# Patient Record
Sex: Female | Born: 1960 | Race: White | Hispanic: No | Marital: Married | Smoking: Current every day smoker
Health system: Southern US, Community
[De-identification: ages and names within clinical notes are randomized; demographics above are authoritative.]

## PROBLEM LIST (undated history)

## (undated) DIAGNOSIS — J309 Allergic rhinitis, unspecified: Secondary | ICD-10-CM

## (undated) DIAGNOSIS — J449 Chronic obstructive pulmonary disease, unspecified: Secondary | ICD-10-CM

## (undated) DIAGNOSIS — G8929 Other chronic pain: Secondary | ICD-10-CM

## (undated) DIAGNOSIS — M47816 Spondylosis without myelopathy or radiculopathy, lumbar region: Secondary | ICD-10-CM

## (undated) DIAGNOSIS — F419 Anxiety disorder, unspecified: Secondary | ICD-10-CM

---
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
## 1993-03-29 HISTORY — PX: SEPTOPLASTY: SUR1290

## 2001-11-13 ENCOUNTER — Encounter: Payer: Self-pay | Admitting: Family Medicine

## 2001-11-13 ENCOUNTER — Ambulatory Visit (HOSPITAL_COMMUNITY): Admission: RE | Admit: 2001-11-13 | Discharge: 2001-11-13 | Payer: Self-pay | Admitting: Family Medicine

## 2002-12-05 ENCOUNTER — Ambulatory Visit (HOSPITAL_COMMUNITY): Admission: RE | Admit: 2002-12-05 | Discharge: 2002-12-05 | Payer: Self-pay | Admitting: Family Medicine

## 2002-12-05 ENCOUNTER — Encounter: Payer: Self-pay | Admitting: Family Medicine

## 2003-12-09 ENCOUNTER — Ambulatory Visit (HOSPITAL_COMMUNITY): Admission: RE | Admit: 2003-12-09 | Discharge: 2003-12-09 | Payer: Self-pay | Admitting: Family Medicine

## 2011-12-16 ENCOUNTER — Other Ambulatory Visit (HOSPITAL_COMMUNITY): Payer: Self-pay | Admitting: Family Medicine

## 2011-12-16 DIAGNOSIS — Z139 Encounter for screening, unspecified: Secondary | ICD-10-CM

## 2011-12-23 ENCOUNTER — Ambulatory Visit (HOSPITAL_COMMUNITY): Payer: Self-pay

## 2012-02-28 ENCOUNTER — Ambulatory Visit (HOSPITAL_COMMUNITY)
Admission: RE | Admit: 2012-02-28 | Discharge: 2012-02-28 | Disposition: A | Discharge status: Home / Self Care | Payer: Medicaid Other | Source: Ambulatory Visit | Attending: Family Medicine | Admitting: Family Medicine

## 2012-02-28 DIAGNOSIS — Z1231 Encounter for screening mammogram for malignant neoplasm of breast: Secondary | ICD-10-CM | POA: Insufficient documentation

## 2012-02-28 DIAGNOSIS — Z139 Encounter for screening, unspecified: Secondary | ICD-10-CM

## 2012-03-01 ENCOUNTER — Other Ambulatory Visit: Payer: Self-pay | Admitting: Family Medicine

## 2012-03-01 DIAGNOSIS — R928 Other abnormal and inconclusive findings on diagnostic imaging of breast: Secondary | ICD-10-CM

## 2012-03-15 ENCOUNTER — Ambulatory Visit (HOSPITAL_COMMUNITY)
Admission: RE | Admit: 2012-03-15 | Discharge: 2012-03-15 | Disposition: A | Discharge status: Home / Self Care | Payer: Medicaid Other | Source: Ambulatory Visit | Attending: Family Medicine | Admitting: Family Medicine

## 2012-03-15 ENCOUNTER — Other Ambulatory Visit: Payer: Self-pay | Admitting: Family Medicine

## 2012-03-15 DIAGNOSIS — R928 Other abnormal and inconclusive findings on diagnostic imaging of breast: Secondary | ICD-10-CM

## 2012-07-12 HISTORY — PX: COLONOSCOPY: SHX174

## 2013-01-05 ENCOUNTER — Emergency Department (HOSPITAL_COMMUNITY): Payer: Medicaid Other

## 2013-01-05 ENCOUNTER — Inpatient Hospital Stay (HOSPITAL_COMMUNITY)
Admission: EM | Admit: 2013-01-05 | Discharge: 2013-01-09 | DRG: 190 | Disposition: A | Discharge status: Home / Self Care | Payer: Medicaid Other | Attending: Internal Medicine | Admitting: Internal Medicine

## 2013-01-05 ENCOUNTER — Inpatient Hospital Stay (HOSPITAL_COMMUNITY): Payer: Medicaid Other

## 2013-01-05 ENCOUNTER — Encounter: Payer: Self-pay | Admitting: Internal Medicine

## 2013-01-05 ENCOUNTER — Encounter (HOSPITAL_COMMUNITY): Payer: Self-pay

## 2013-01-05 DIAGNOSIS — R0609 Other forms of dyspnea: Secondary | ICD-10-CM

## 2013-01-05 DIAGNOSIS — J309 Allergic rhinitis, unspecified: Secondary | ICD-10-CM | POA: Diagnosis present

## 2013-01-05 DIAGNOSIS — J218 Acute bronchiolitis due to other specified organisms: Secondary | ICD-10-CM | POA: Diagnosis present

## 2013-01-05 DIAGNOSIS — I5033 Acute on chronic diastolic (congestive) heart failure: Secondary | ICD-10-CM | POA: Diagnosis present

## 2013-01-05 DIAGNOSIS — J9600 Acute respiratory failure, unspecified whether with hypoxia or hypercapnia: Secondary | ICD-10-CM | POA: Diagnosis present

## 2013-01-05 DIAGNOSIS — G8929 Other chronic pain: Secondary | ICD-10-CM | POA: Diagnosis present

## 2013-01-05 DIAGNOSIS — J441 Chronic obstructive pulmonary disease with (acute) exacerbation: Principal | ICD-10-CM | POA: Diagnosis present

## 2013-01-05 DIAGNOSIS — I509 Heart failure, unspecified: Secondary | ICD-10-CM | POA: Diagnosis present

## 2013-01-05 DIAGNOSIS — E876 Hypokalemia: Secondary | ICD-10-CM | POA: Diagnosis present

## 2013-01-05 DIAGNOSIS — F411 Generalized anxiety disorder: Secondary | ICD-10-CM | POA: Diagnosis present

## 2013-01-05 DIAGNOSIS — J189 Pneumonia, unspecified organism: Secondary | ICD-10-CM | POA: Diagnosis present

## 2013-01-05 DIAGNOSIS — J479 Bronchiectasis, uncomplicated: Secondary | ICD-10-CM | POA: Diagnosis present

## 2013-01-05 DIAGNOSIS — J9601 Acute respiratory failure with hypoxia: Secondary | ICD-10-CM | POA: Diagnosis present

## 2013-01-05 DIAGNOSIS — R0902 Hypoxemia: Secondary | ICD-10-CM | POA: Diagnosis present

## 2013-01-05 DIAGNOSIS — R0600 Dyspnea, unspecified: Secondary | ICD-10-CM

## 2013-01-05 DIAGNOSIS — F17200 Nicotine dependence, unspecified, uncomplicated: Secondary | ICD-10-CM | POA: Diagnosis present

## 2013-01-05 DIAGNOSIS — Z79899 Other long term (current) drug therapy: Secondary | ICD-10-CM

## 2013-01-05 DIAGNOSIS — D72829 Elevated white blood cell count, unspecified: Secondary | ICD-10-CM | POA: Diagnosis present

## 2013-01-05 DIAGNOSIS — J84115 Respiratory bronchiolitis interstitial lung disease: Secondary | ICD-10-CM | POA: Diagnosis present

## 2013-01-05 HISTORY — DX: Allergic rhinitis, unspecified: J30.9

## 2013-01-05 HISTORY — DX: Other chronic pain: G89.29

## 2013-01-05 HISTORY — DX: Chronic obstructive pulmonary disease, unspecified: J44.9

## 2013-01-05 HISTORY — DX: Spondylosis without myelopathy or radiculopathy, lumbar region: M47.816

## 2013-01-05 HISTORY — DX: Anxiety disorder, unspecified: F41.9

## 2013-01-05 LAB — BASIC METABOLIC PANEL
BUN: 19 mg/dL (ref 6–23)
Calcium: 9.7 mg/dL (ref 8.4–10.5)
GFR calc non Af Amer: >90 mL/min (ref >90)
Glucose, Bld: 100 mg/dL — ABNORMAL HIGH (ref 70–99)

## 2013-01-05 LAB — CBC WITH DIFFERENTIAL/PLATELET
Basophils Absolute: 0.2 10*3/uL — ABNORMAL HIGH (ref 0.0–0.1)
Eosinophils Absolute: 0.2 10*3/uL (ref 0.0–0.7)
Lymphs Abs: 1.7 10*3/uL (ref 0.7–4.0)
MCH: 32.7 pg (ref 26.0–34.0)
MCHC: 34.5 g/dL (ref 30.0–36.0)
MCV: 94.6 fL (ref 78.0–100.0)
Monocytes Absolute: 2.4 10*3/uL — ABNORMAL HIGH (ref 0.1–1.0)
Platelets: 419 10*3/uL — ABNORMAL HIGH (ref 150–400)
RDW: 13.2 % (ref 11.5–15.5)
WBC: 23.7 10*3/uL — ABNORMAL HIGH (ref 4.0–10.5)

## 2013-01-05 LAB — MRSA PCR SCREENING: MRSA by PCR: NEGATIVE

## 2013-01-05 LAB — STREP PNEUMONIAE URINARY ANTIGEN: Strep Pneumo Urinary Antigen: NEGATIVE

## 2013-01-05 LAB — HIV ANTIBODY (ROUTINE TESTING W REFLEX): HIV: NONREACTIVE

## 2013-01-05 LAB — EXPECTORATED SPUTUM ASSESSMENT W GRAM STAIN, RFLX TO RESP C

## 2013-01-05 MED ORDER — MORPHINE SULFATE ER 30 MG PO TBCR
30.0000 mg | EXTENDED_RELEASE_TABLET | Freq: Two times a day (BID) | ORAL | Status: DC
  Administered 2013-01-05 – 2013-01-09 (×8): 30 mg via ORAL
  Filled 2013-01-05 (×9): qty 1

## 2013-01-05 MED ORDER — SODIUM CHLORIDE 0.9 % IV SOLN
INTRAVENOUS | Status: DC
  Administered 2013-01-05: 12:00:00 via INTRAVENOUS

## 2013-01-05 MED ORDER — ALBUTEROL (5 MG/ML) CONTINUOUS INHALATION SOLN
15.0000 mg/h | INHALATION_SOLUTION | Freq: Once | RESPIRATORY_TRACT | Status: AC
  Administered 2013-01-05: 15 mg/h via RESPIRATORY_TRACT
  Filled 2013-01-05: qty 20

## 2013-01-05 MED ORDER — SODIUM CHLORIDE 0.9 % IJ SOLN: 3.0000 mL | INTRAMUSCULAR | Status: DC | PRN

## 2013-01-05 MED ORDER — FUROSEMIDE 10 MG/ML IJ SOLN
40.0000 mg | Freq: Once | INTRAMUSCULAR | Status: AC
  Administered 2013-01-05: 40 mg via INTRAVENOUS
  Filled 2013-01-05: qty 4

## 2013-01-05 MED ORDER — SERTRALINE HCL 100 MG PO TABS
150.0000 mg | ORAL_TABLET | Freq: Every day | ORAL | Status: DC
  Administered 2013-01-05 – 2013-01-09 (×5): 150 mg via ORAL
  Filled 2013-01-05 (×5): qty 1

## 2013-01-05 MED ORDER — METHYLPREDNISOLONE SODIUM SUCC 125 MG IJ SOLR
125.0000 mg | Freq: Once | INTRAMUSCULAR | Status: AC
  Administered 2013-01-05: 125 mg via INTRAVENOUS
  Filled 2013-01-05: qty 2

## 2013-01-05 MED ORDER — ALBUTEROL SULFATE (5 MG/ML) 0.5% IN NEBU
2.5000 mg | INHALATION_SOLUTION | RESPIRATORY_TRACT | Status: DC
  Administered 2013-01-05 – 2013-01-09 (×23): 2.5 mg via RESPIRATORY_TRACT
  Filled 2013-01-05 (×23): qty 0.5

## 2013-01-05 MED ORDER — ALPRAZOLAM 1 MG PO TABS
2.0000 mg | ORAL_TABLET | Freq: Three times a day (TID) | ORAL | Status: DC | PRN
Start: 2013-01-05 — End: 2013-01-09
  Administered 2013-01-07 – 2013-01-08 (×4): 2 mg via ORAL
  Filled 2013-01-05 (×5): qty 2

## 2013-01-05 MED ORDER — IOHEXOL 300 MG/ML  SOLN
80.0000 mL | Freq: Once | INTRAMUSCULAR | Status: AC | PRN
  Administered 2013-01-05: 80 mL via INTRAVENOUS

## 2013-01-05 MED ORDER — AZITHROMYCIN 500 MG PO TABS
500.0000 mg | ORAL_TABLET | ORAL | Status: DC
  Administered 2013-01-05 – 2013-01-08 (×4): 500 mg via ORAL
  Filled 2013-01-05 (×5): qty 1

## 2013-01-05 MED ORDER — LEVOFLOXACIN IN D5W 500 MG/100ML IV SOLN
500.0000 mg | INTRAVENOUS | Status: DC
  Filled 2013-01-05: qty 100

## 2013-01-05 MED ORDER — METHYLPREDNISOLONE SODIUM SUCC 40 MG IJ SOLR
40.0000 mg | Freq: Four times a day (QID) | INTRAMUSCULAR | Status: DC
  Administered 2013-01-05 – 2013-01-06 (×4): 40 mg via INTRAVENOUS
  Filled 2013-01-05 (×7): qty 1

## 2013-01-05 MED ORDER — IPRATROPIUM BROMIDE 0.02 % IN SOLN
1.0000 mg | Freq: Once | RESPIRATORY_TRACT | Status: AC
  Administered 2013-01-05: 1 mg via RESPIRATORY_TRACT
  Filled 2013-01-05 (×2): qty 2.5

## 2013-01-05 MED ORDER — SODIUM CHLORIDE 0.9 % IV SOLN: 250.0000 mL | INTRAVENOUS | Status: DC | PRN

## 2013-01-05 MED ORDER — ALBUTEROL SULFATE (5 MG/ML) 0.5% IN NEBU: 2.5000 mg | INHALATION_SOLUTION | RESPIRATORY_TRACT | Status: DC | PRN

## 2013-01-05 MED ORDER — DEXTROSE 5 % IV SOLN
1.0000 g | INTRAVENOUS | Status: DC
  Administered 2013-01-05 – 2013-01-08 (×4): 1 g via INTRAVENOUS
  Filled 2013-01-05 (×5): qty 10

## 2013-01-05 MED ORDER — ENOXAPARIN SODIUM 40 MG/0.4ML ~~LOC~~ SOLN
40.0000 mg | SUBCUTANEOUS | Status: DC
  Administered 2013-01-05 – 2013-01-08 (×4): 40 mg via SUBCUTANEOUS
  Filled 2013-01-05 (×5): qty 0.4

## 2013-01-05 MED ORDER — SODIUM CHLORIDE 0.9 % IJ SOLN
3.0000 mL | Freq: Two times a day (BID) | INTRAMUSCULAR | Status: DC
  Administered 2013-01-05 – 2013-01-08 (×5): 3 mL via INTRAVENOUS

## 2013-01-05 NOTE — ED Provider Notes (Signed)
CSN: [id number]     Arrival date & time 01/05/13  1016 History   First MD Initiated Contact with Patient 01/05/13 1034     Chief Complaint  Patient presents with  . Pneumonia    HPI Pt was seen at 1045.  Per pt, c/o gradual onset and worsening of persistent cough, wheezing and SOB for the past 6 days.  Has been associated with generalized body aches/fatigue and subjective home fevers/chills. States she has been using her home MDI without relief. Pt was evaluated by her PMD this morning, and then sent to the ED for further evaluation and admission for hypoxia and possible pneumonia. Pt has significant hx COPD and continues to smoke cigarettes. Denies CP/palpitations, no back pain, no abd pain, no N/V/D, no rash.    Past Medical History  Diagnosis Date  . Chronic pain   . Pneumonia   . COPD (chronic obstructive pulmonary disease)   . Anxiety   . Allergic rhinitis   . Lumbar spondylosis    Past Surgical History  Procedure Laterality Date  . Back surgery    . Septoplasty  03/29/1993  . Tubal ligation  1973  . Cholecystectomy  1987  . Appendectomy  1987    History  Substance Use Topics  . Smoking status: Current Every Day Smoker  . Smokeless tobacco: Never Used  . Alcohol Use: No    Review of Systems ROS: Statement: All systems negative except as marked or noted in the HPI; Constitutional: +subjective fever and chills, generalized body aches/fatigue. ; ; Eyes: Negative for eye pain, redness and discharge. ; ; ENMT: Negative for ear pain, hoarseness, nasal congestion, sinus pressure and sore throat. ; ; Cardiovascular: Negative for chest pain, palpitations, diaphoresis, and peripheral edema. ; ; Respiratory: +cough, wheezing, SOB. Negative for stridor. ; ; Gastrointestinal: Negative for nausea, vomiting, diarrhea, abdominal pain, blood in stool, hematemesis, jaundice and rectal bleeding. . ; ; Genitourinary: Negative for dysuria, flank pain and hematuria. ; ; Musculoskeletal: Negative  for back pain and neck pain. Negative for swelling and trauma.; ; Skin: Negative for pruritus, rash, abrasions, blisters, bruising and skin lesion.; ; Neuro: Negative for headache, lightheadedness and neck stiffness. Negative for weakness, altered level of consciousness , altered mental status, extremity weakness, paresthesias, involuntary movement, seizure and syncope.      Allergies  Review of patient's allergies indicates no known allergies.  Home Medications   Current Outpatient Rx  Name  Route  Sig  Dispense  Refill  . alprazolam (XANAX) 2 MG tablet   Oral   Take 2 mg by mouth 3 (three) times daily as needed for anxiety.         Marland Kitchen HYDROcodone-acetaminophen (NORCO) 10-325 MG per tablet   Oral   Take 1 tablet by mouth every 6 (six) hours as needed for pain.         Marland Kitchen morphine (MS CONTIN) 30 MG 12 hr tablet   Oral   Take 30 mg by mouth 2 (two) times daily.         . sertraline (ZOLOFT) 100 MG tablet   Oral   Take 150 mg by mouth daily.          BP 124/67  Pulse 136  Temp(Src) 97.8 F (36.6 C) (Oral)  Resp 20  SpO2 91% Physical Exam 1050: Physical examination:  Nursing notes reviewed; Vital signs and O2 SAT reviewed;  Constitutional: Well developed, Well nourished, Uncomfortable appearing; Head:  Normocephalic, atraumatic; Eyes: EOMI, PERRL, No scleral  icterus; ENMT: Mouth and pharynx normal, Mucous membranes dry; Neck: Supple, Full range of motion, No lymphadenopathy; Cardiovascular: Tachycardic rate and rhythm, No gallop; Respiratory: Breath sounds diminished & equal bilaterally, faint scattered wheezes.  Speaking in phrases. +moist cough. No retrax or access mm use.Tachypneic.; Chest: Nontender, Movement normal; Abdomen: Soft, Nontender, Nondistended, Normal bowel sounds; Genitourinary: No CVA tenderness; Extremities: Pulses normal, No tenderness, No edema, No calf edema or asymmetry.; Neuro: AA&Ox3, Major CN grossly intact.  Speech clear. No gross focal motor or  sensory deficits in extremities.; Skin: Color normal, Warm, Dry.   ED Course  Procedures   1055: On arrival, pt O2 Sats in 60-70's on R/A. Tachypneic, lung sounds significantly diminished. Will start continuous neb and dose IV solumedrol.   1200: PCXR without acute infiltrate, has diffuse interstitial airspace disease; questioning edema. BNP ordered. Continuous neb completed. Lungs now diminished but coarse bilat. Sats 91-96% on O2 2L N/C. States she feels her breathing is "better" after the neb. Speaking full sentences, less tachypneic. Does endorse being "shakey." Sinus tachycardia on monitor. Dx and testing d/w pt and family.  Questions answered.  Verb understanding, agreeable to admit.  1225:  BNP elevated, will dose IV lasix. Will start IV abx for COPD exacerbation. T/C to [facility] Dr. Izola Price, case discussed, including:  HPI, pertinent PM/SHx, VS/PE, dx testing, ED course and treatment:  Agreeable to admit, requests to write temporary orders, obtain stepdown bed to team 8.   MDM  MDM Reviewed: previous chart, nursing note and vitals Reviewed previous: labs Interpretation: labs, ECG and x-ray Total time providing critical care: 30-74 minutes. This excludes time spent performing separately reportable procedures and services. Consults: admitting MD     CRITICAL CARE Performed by: Laray Anger Total critical care time: 45 Critical care time was exclusive of separately billable procedures and treating other patients. Critical care was necessary to treat or prevent imminent or life-threatening deterioration. Critical care was time spent personally by me on the following activities: development of treatment plan with patient and/or surrogate as well as nursing, discussions with consultants, evaluation of patient's response to treatment, examination of patient, obtaining history from patient or surrogate, ordering and performing treatments and interventions, ordering and review of  laboratory studies, ordering and review of radiographic studies, pulse oximetry and re-evaluation of patient's condition.    Date: 01/05/2013  Rate: 103  Rhythm: sinus tachycardia  QRS Axis: normal  Intervals: normal  ST/T Wave abnormalities: normal  Conduction Disutrbances:none  Narrative Interpretation:   Old EKG Reviewed: none available.  Dg Chest Port 1 View 01/05/2013   *RADIOLOGY REPORT*  Clinical Data: Pneumonia.  PORTABLE CHEST - 1 VIEW  Comparison: Two-view chest 06/30/2012.  Findings: The heart size is normal.  A diffuse interstitial pattern is evident.  Biapical pleural thickening is stable. Emphysematous changes are again noted.  No focal airspace consolidation is present.  The visualized soft tissues and bony thorax are unremarkable.  IMPRESSION:  1.  Diffuse interstitial and airspace disease likely reflects edema.  Atypical infection is also considered. 2.  Emphysema.   Original Report Authenticated By: Marin Roberts, M.D.    Results for orders placed during the hospital encounter of 01/05/13  TROPONIN I      Result Value Range   Troponin I <0.30  <0.30 ng/mL  CBC WITH DIFFERENTIAL      Result Value Range   WBC 23.7 (*) 4.0 - 10.5 K/uL   RBC 4.10  3.87 - 5.11 MIL/uL   Hemoglobin 13.4  12.0 -  15.0 g/dL   HCT 16.1  09.6 - 04.5 %   MCV 94.6  78.0 - 100.0 fL   MCH 32.7  26.0 - 34.0 pg   MCHC 34.5  30.0 - 36.0 g/dL   RDW 40.9  81.1 - 91.4 %   Platelets 419 (*) 150 - 400 K/uL   Neutrophils Relative % 81 (*) 43 - 77 %   Lymphocytes Relative 7 (*) 12 - 46 %   Monocytes Relative 10  3 - 12 %   Eosinophils Relative 1  0 - 5 %   Basophils Relative 1  0 - 1 %   Neutro Abs 19.2 (*) 1.7 - 7.7 K/uL   Lymphs Abs 1.7  0.7 - 4.0 K/uL   Monocytes Absolute 2.4 (*) 0.1 - 1.0 K/uL   Eosinophils Absolute 0.2  0.0 - 0.7 K/uL   Basophils Absolute 0.2 (*) 0.0 - 0.1 K/uL   RBC Morphology POLYCHROMASIA PRESENT     WBC Morphology INCREASED BANDS (>20% BANDS)     Smear Review LARGE  PLATELETS PRESENT    BASIC METABOLIC PANEL      Result Value Range   Sodium 139  135 - 145 mEq/L   Potassium 3.4 (*) 3.5 - 5.1 mEq/L   Chloride 96  96 - 112 mEq/L   CO2 31  19 - 32 mEq/L   Glucose, Bld 100 (*) 70 - 99 mg/dL   BUN 19  6 - 23 mg/dL   Creatinine, Ser 7.82  0.50 - 1.10 mg/dL   Calcium 9.7  8.4 - 95.6 mg/dL   GFR calc non Af Amer >90  >90 mL/min   GFR calc Af Amer >90  >90 mL/min  LACTIC ACID, PLASMA      Result Value Range   Lactic Acid, Venous 1.0  0.5 - 2.2 mmol/L  PRO B NATRIURETIC PEPTIDE      Result Value Range   Pro B Natriuretic peptide (BNP) 2025.0 (*) 0 - 125 pg/mL   Dg Chest Port 1 View 01/05/2013   *RADIOLOGY REPORT*  Clinical Data: Pneumonia.  PORTABLE CHEST - 1 VIEW  Comparison: Two-view chest 06/30/2012.  Findings: The heart size is normal.  A diffuse interstitial pattern is evident.  Biapical pleural thickening is stable. Emphysematous changes are again noted.  No focal airspace consolidation is present.  The visualized soft tissues and bony thorax are unremarkable.  IMPRESSION:  1.  Diffuse interstitial and airspace disease likely reflects edema.  Atypical infection is also considered. 2.  Emphysema.   Original Report Authenticated By: Marin Roberts, M.D.      Laray Anger, DO 01/07/13 1212

## 2013-01-05 NOTE — ED Notes (Signed)
Per EMS pt sent here from [facility] to be arranged for admission by [facility] for PNA.

## 2013-01-05 NOTE — H&P (Signed)
  Pt from Dr. Benedetto Goad office, admission requested for management of COPD, pt is heavy smoker, now with worsening cough, productive and associated with subjective fevers, chills, CXR in the office with interstitial pneumonitis. Oxygen saturation on RA was 88% in the office. No recent blood work available. Telemetry bed requested.   Debbora Presto, MD  [facility] Pager [phone]  If 7PM-7AM, please contact night-coverage [email or web address] Password [email or web address]

## 2013-01-05 NOTE — H&P (Addendum)
[facility] History and Physical  Doris Obrien NWG:[id number] DOB: May 28, 1960 DOA: 01/05/2013  Referring physician: ED physician PCP: Pamelia Hoit, MD   Chief Complaint: shortness of breath   HPI:  Pt is 52 yo female with known history of COPD, current smoker 1 ppd, who was seen earlier by her PCP for progressively worsening shortness of breath that she initially noted 1 week prior to this admission and present with exertion only. This has gotten worse and now present at rest as well, associated with mostly non productive cough, subjective fevers, chills, malaise. Pt denies similar episodes in the past that were caused by COPD flare, she is not on oxygen at home. Her PCP noted oxygen saturation in mid 80's on RA and has noted significant wheezing and mild respiratory distress. She was referred for an admission to [facility].   In ED, pt found to be hypoxic with O2 sat as low as 60% on RA but has responded well to nebulizer treatment, solumedrol, oxygen via [place]. [facility] asked to admit to SDU for evaluation and management of what appears to be COPD flare.   Assessment and Plan:  Principal Problem:   Acute respiratory failure with hypoxia - this appears to be mostly secondary to COPD exacerbation, ? PNA and per CXR worrisome for pulmonary vascular congestion - will admit to SDU as recommended and will start with providing supportive care with nebulizers, oxygen, solumedrol - will also obtain CT chest for clearer evaluation - obtain 2 D ECHO and check BNP to evaluate for heart failure , strict I's and O's, daily weights  - start Zithro and Rocephin for now - sputum analysis ordered  Active Problems:   COPD exacerbation - management as noted above    Leukocytosis, unspecified - likely secondary to principal problem and administration of solumedrol  - continue ABX as noted above and repeat BMP in AM   Hypokalemia - mild and secondary to beta agonist, albuterol - will supplement and repeat  BMP in AM  Code Status: Full Family Communication: Pt at bedside Disposition Plan: Admit to SDU   Review of Systems:  Constitutional: Positive for malaise/fatigue. Negative for diaphoresis.  HENT: Negative for hearing loss, ear pain, nosebleeds, congestion, sore throat, neck pain, tinnitus and ear discharge.   Eyes: Negative for blurred vision, double vision, photophobia, pain, discharge and redness.  Respiratory: Negative for hemoptysis, sputum production,  and stridor.   Cardiovascular: Negative for chest pain, palpitations, orthopnea, claudication and leg swelling.  Gastrointestinal: Negative for nausea, vomiting and abdominal pain. Negative for heartburn, constipation, blood in stool and melena.  Genitourinary: Negative for dysuria, urgency, frequency, hematuria and flank pain.  Musculoskeletal: Negative for myalgias, back pain, joint pain and falls.  Skin: Negative for itching and rash.  Neurological: Negative for tingling, tremors, sensory change, speech change, focal weakness, loss of consciousness and headaches.  Endo/Heme/Allergies: Negative for environmental allergies and polydipsia. Does not bruise/bleed easily.  Psychiatric/Behavioral: Negative for suicidal ideas. The patient is not nervous/anxious.      Past Medical History  Diagnosis Date  . Chronic pain   . Pneumonia   . COPD (chronic obstructive pulmonary disease)   . Anxiety   . Allergic rhinitis   . Lumbar spondylosis     Past Surgical History  Procedure Laterality Date  . Back surgery    . Septoplasty  03/29/1993  . Tubal ligation  1973  . Cholecystectomy  1987  . Appendectomy  1987    Social History:  reports that she  has been smoking.  She has never used smokeless tobacco. She reports that she does not drink alcohol or use illicit drugs.  No Known Allergies  Family History  Problem Relation Age of Onset  . Depression Other   . Diabetes Other     Prior to Admission medications   Medication Sig  Start Date End Date Taking? Authorizing Provider  alprazolam Prudy Feeler) 2 MG tablet Take 2 mg by mouth 3 (three) times daily as needed for anxiety.   Yes Historical Provider, MD  HYDROcodone-acetaminophen (NORCO) 10-325 MG per tablet Take 1 tablet by mouth every 6 (six) hours as needed for pain.   Yes Historical Provider, MD  morphine (MS CONTIN) 30 MG 12 hr tablet Take 30 mg by mouth 2 (two) times daily.   Yes Historical Provider, MD  sertraline (ZOLOFT) 100 MG tablet Take 150 mg by mouth daily.   Yes Historical Provider, MD    Physical Exam: Filed Vitals:   01/05/13 1021 01/05/13 1046 01/05/13 1123 01/05/13 1230  BP:  107/80  124/67  Pulse:  113  143  Temp:  97.8 F (36.6 C)    TempSrc:  Oral    Resp:  20  21  SpO2: 20% 68% 96% 90%    Physical Exam  Constitutional: Appears well-developed and well-nourished. No distress.  HENT: Normocephalic. External right and left ear normal. Oropharynx is clear and moist.  Eyes: Conjunctivae and EOM are normal. PERRLA, no scleral icterus.  Neck: Normal ROM. Neck supple. No JVD. No tracheal deviation. No thyromegaly.  CVS: Regular rhythm, tachycardic, S1/S2 +, no murmurs, no gallops, no carotid bruit.  Pulmonary: Course breath sounds bilaterally, expiratory wheezing present  Abdominal: Soft. BS +,  no distension, tenderness, rebound or guarding.  Musculoskeletal: Normal range of motion. No edema and no tenderness.  Lymphadenopathy: No lymphadenopathy noted, cervical, inguinal. Neuro: Alert. Normal reflexes, muscle tone coordination. No cranial nerve deficit. Skin: Skin is warm and dry. No rash noted. Not diaphoretic. No erythema. No pallor.  Psychiatric: Normal mood and affect. Behavior, judgment, thought content normal.   Labs on Admission:  Basic Metabolic Panel:  Recent Labs Lab 01/05/13 1125  NA 139  K 3.4*  CL 96  CO2 31  GLUCOSE 100*  BUN 19  CREATININE 0.69  CALCIUM 9.7   CBC:  Recent Labs Lab 01/05/13 1125  WBC 23.7*   NEUTROABS 19.2*  HGB 13.4  HCT 38.8  MCV 94.6  PLT 419*   Cardiac Enzymes:  Recent Labs Lab 01/05/13 1125  TROPONINI <0.30   Radiological Exams on Admission: Dg Chest Port 1 View  01/05/2013   *RADIOLOGY REPORT*  Clinical Data: Pneumonia.  PORTABLE CHEST - 1 VIEW  Comparison: Two-view chest 06/30/2012.  Findings: The heart size is normal.  A diffuse interstitial pattern is evident.  Biapical pleural thickening is stable. Emphysematous changes are again noted.  No focal airspace consolidation is present.  The visualized soft tissues and bony thorax are unremarkable.  IMPRESSION:  1.  Diffuse interstitial and airspace disease likely reflects edema.  Atypical infection is also considered. 2.  Emphysema.   Original Report Authenticated By: Marin Roberts, M.D.    EKG: Normal sinus rhythm, no ST/T wave changes  Debbora Presto, MD  [facility] Pager [phone]  If 7PM-7AM, please contact night-coverage [email or web address] Password [facility] 01/05/2013, 12:48 PM

## 2013-01-05 NOTE — Progress Notes (Signed)
CARE MANAGEMENT NOTE 01/05/2013  Patient:  Doris Obrien, Doris Obrien   Account Number:  [id number]  Date Initiated:  01/05/2013  Documentation initiated by:  Dailyn Kempner  Subjective/Objective Assessment:   pt with hx of resp problems sent from [facility] after failed outpt tz of copd excerbation     Action/Plan:   home when stable   Anticipated DC Date:  01/08/2013   Anticipated DC Plan:  HOME/SELF CARE  In-house referral  NA      DC Planning Services  NA      [facility] Choice  NA   Choice offered to / List presented to:  NA   DME arranged  NA      DME agency  NA     HH arranged  NA      HH agency  NA   Status of service:  In process, will continue to follow Medicare Important Message given?  NA - LOS <3 / Initial given by admissions (If response is "NO", the following Medicare IM given date fields will be blank) Date Medicare IM given:   Date Additional Medicare IM given:    Discharge Disposition:    Per UR Regulation:  Reviewed for med. necessity/level of care/duration of stay  If discussed at Long Length of Stay Meetings, dates discussed:    Comments:  [id number]/Aila Terra Stark Jock, BSN, [place] [phone] Chart Reviewed for discharge and hospital needs. Discharge needs at time of review:  None/ patient is presently not on home 02 may require intiation upon dc. Review of patient progress due on [id number].

## 2013-01-06 DIAGNOSIS — E876 Hypokalemia: Secondary | ICD-10-CM

## 2013-01-06 DIAGNOSIS — J84115 Respiratory bronchiolitis interstitial lung disease: Secondary | ICD-10-CM | POA: Diagnosis present

## 2013-01-06 DIAGNOSIS — J441 Chronic obstructive pulmonary disease with (acute) exacerbation: Principal | ICD-10-CM

## 2013-01-06 DIAGNOSIS — J9600 Acute respiratory failure, unspecified whether with hypoxia or hypercapnia: Secondary | ICD-10-CM

## 2013-01-06 DIAGNOSIS — J479 Bronchiectasis, uncomplicated: Secondary | ICD-10-CM

## 2013-01-06 LAB — BASIC METABOLIC PANEL
BUN: 17 mg/dL (ref 6–23)
Calcium: 9.8 mg/dL (ref 8.4–10.5)
GFR calc non Af Amer: >90 mL/min (ref >90)
Glucose, Bld: 193 mg/dL — ABNORMAL HIGH (ref 70–99)

## 2013-01-06 LAB — CBC
Hemoglobin: 12.6 g/dL (ref 12.0–15.0)
MCH: 32.7 pg (ref 26.0–34.0)
MCHC: 34.5 g/dL (ref 30.0–36.0)
MCV: 94.8 fL (ref 78.0–100.0)
Platelets: 397 10*3/uL (ref 150–400)

## 2013-01-06 MED ORDER — POTASSIUM CHLORIDE CRYS ER 20 MEQ PO TBCR
40.0000 meq | EXTENDED_RELEASE_TABLET | ORAL | Status: AC
  Administered 2013-01-06 (×3): 40 meq via ORAL
  Filled 2013-01-06 (×3): qty 2

## 2013-01-06 MED ORDER — MOMETASONE FURO-FORMOTEROL FUM 200-5 MCG/ACT IN AERO
2.0000 | INHALATION_SPRAY | Freq: Two times a day (BID) | RESPIRATORY_TRACT | Status: DC
  Administered 2013-01-06 – 2013-01-09 (×7): 2 via RESPIRATORY_TRACT
  Filled 2013-01-06: qty 8.8

## 2013-01-06 MED ORDER — METHYLPREDNISOLONE SODIUM SUCC 40 MG IJ SOLR
40.0000 mg | Freq: Two times a day (BID) | INTRAMUSCULAR | Status: DC
Start: 2013-01-07 — End: 2013-01-07
  Administered 2013-01-07: 40 mg via INTRAVENOUS
  Filled 2013-01-06 (×3): qty 1

## 2013-01-06 NOTE — Progress Notes (Signed)
Smoking cessation handouts given to patient.  Smoking cessation discussed.  O2 sats and oxygen discussed.  Patient verbalized understanding.

## 2013-01-06 NOTE — Consult Note (Signed)
PULMONARY  / CRITICAL CARE MEDICINE  Name: Doris Obrien MRN: [id number] DOB: [birth year]    ADMISSION DATE:  01/05/2013 CONSULTATION DATE:  01/06/2013   REFERRING MD :  Izola Price  PRIMARY SERVICE:  [facility]  CHIEF COMPLAINT:   Abnormal CT scan, acute bronchitis  History of present illness: This is a 52 year old white female who presents with increased dyspnea and dry cough with low-grade fever chills and now weighs. The patient was found in the emergency room to be hypoxic to 60% on room air but responded to nebulized therapy and oxygen and the patient was admitted to the step down unit for care. 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 Pulmonary was asked to comment on the CT scan and management  The patient reports 2 episodes of bronchitis and we're for the past 5 years. The patient also has frequent sinus infections. The patient continues to smoke a pack a day has done so for many years. She's been diagnosis COPD before in the past and was placed on Spiriva. Her insurance would not cover this and she was switched out to just a inhaler with albuterol and and alone but was not on a regular inhaled medication.  The patient denies chest pain. There is chest tightness. There shortness of breath at rest and exertion. There is sinus congestion noted.   SIGNIFICANT EVENTS / STUDIES:    LINES / TUBES: PIV  CULTURES: Respiratory culture 01/05/2013: Blood culture 01/05/2013  ANTIBIOTICS: Azithromycin 01/05/2013 Rocephin 01/05/2013     PAST MEDICAL HISTORY :  Past Medical History  Diagnosis Date  . Chronic pain   . Pneumonia   . COPD (chronic obstructive pulmonary disease)   . Anxiety   . Allergic rhinitis   . Lumbar spondylosis    Past Surgical History  Procedure Laterality Date  . Back surgery    . Septoplasty  03/29/1993  . Tubal ligation  1973  . Cholecystectomy  1987  .  Appendectomy  1987   Prior to Admission medications   Medication Sig Start Date End Date Taking? Authorizing Provider  alprazolam Prudy Feeler) 2 MG tablet Take 2 mg by mouth 3 (three) times daily as needed for anxiety.   Yes Historical Provider, MD  HYDROcodone-acetaminophen (NORCO) 10-325 MG per tablet Take 1 tablet by mouth every 6 (six) hours as needed for pain.   Yes Historical Provider, MD  morphine (MS CONTIN) 30 MG 12 hr tablet Take 30 mg by mouth 2 (two) times daily.   Yes Historical Provider, MD  sertraline (ZOLOFT) 100 MG tablet Take 150 mg by mouth daily.   Yes Historical Provider, MD   No Known Allergies  FAMILY HISTORY:  Family History  Problem Relation Age of Onset  . Depression Other   . Diabetes Other    SOCIAL HISTORY:  reports that she has been smoking.  She has never used smokeless tobacco. She reports that she does not drink alcohol or use illicit drugs.  REVIEW OF SYSTEMS:   Positive in BOLD Constitutional:  fever, chills, weight loss, malaise/fatigue and diaphoresis.  HENT: Negative for hearing loss, ear pain, nosebleeds, congestion, sore throat, neck pain, tinnitus and ear discharge.   Eyes: Negative for blurred vision, double vision, photophobia, pain, discharge and redness.  Respiratory:  cough, hemoptysis, sputum production, shortness of breath, wheezing and stridor.   Cardiovascular: Negative for chest pain, palpitations, orthopnea, claudication, leg swelling and PND.  Gastrointestinal: Negative for heartburn, nausea, vomiting,  abdominal pain, diarrhea, constipation, blood in stool and melena.  Genitourinary: Negative for dysuria, urgency, frequency, hematuria and flank pain.  Musculoskeletal: Negative for myalgias, back pain, joint pain and falls.  Skin: Negative for itching and rash.  Neurological: Negative for dizziness, tingling, tremors, sensory change, speech change, focal weakness, seizures, loss of consciousness, weakness and headaches.   Endo/Heme/Allergies: Negative for environmental allergies and polydipsia. Does not bruise/bleed easily.  SUBJECTIVE:  The patient states she is improved from admission VITAL SIGNS: Temp:  [96.9 F (36.1 C)-98.5 F (36.9 C)] 97.8 F (36.6 C) (09/06 1200) Pulse Rate:  [95-143] 99 (09/06 0800) Resp:  [16-25] 18 (09/06 0800) BP: (101-134)/(60-84) 114/76 mmHg (09/06 0800) SpO2:  [90 %-98 %] 93 % (09/06 1138) FiO2 (%):  [75 %] 75 % (09/06 1138) Weight:  [52.5 kg (115 lb 11.9 oz)-57.6 kg (126 lb 15.8 oz)] 52.5 kg (115 lb 11.9 oz) (09/06 0448)  PHYSICAL EXAMINATION: General:  Well-developed white female in no distress Neuro:  Awake and alert with no focal deficits HEENT:  No jugular venous distention no thyromegaly moist mucous membranes Cardiovascular:  Regular rate and rhythm normal S1-S2 no murmur rub heave or gallop Lungs:  Distant breath sounds with expired wheezes scattered rhonchi Abdomen:  Soft nontender bowel sounds active no organomegaly Musculoskeletal:  Full range of motion no joint deformity Skin:  Clear   Recent Labs Lab 01/05/13 1125 01/06/13 0355  NA 139 133*  K 3.4* 3.0*  CL 96 90*  CO2 31 34*  BUN 19 17  CREATININE 0.69 0.78  GLUCOSE 100* 193*    Recent Labs Lab 01/05/13 1125 01/06/13 0355  HGB 13.4 12.6  HCT 38.8 36.5  WBC 23.7* 22.8*  PLT 419* 397   Ct Chest W Contrast  01/05/2013   *RADIOLOGY REPORT*  Clinical Data: Hypoxia.  Shortness of breath.  Evaluate for interstitial lung disease.  CT CHEST WITH CONTRAST  Technique:  Multidetector CT imaging of the chest was performed following the standard protocol during bolus administration of intravenous contrast.  Contrast: 80mL OMNIPAQUE IOHEXOL 300 MG/ML  SOLN  Comparison: No priors.  Findings:  Mediastinum: Heart size is normal. There is no significant pericardial fluid, thickening or pericardial calcification.  Mildly enlarged low left paratracheal lymph node measuring 12 mm in short axis.  No other  definite pathologically enlarged mediastinal or hilar lymph nodes are otherwise noted, although there are numerous borderline enlarged lymph nodes in the mediastinum.  The esophagus is unremarkable in appearance.  Lungs/Pleura: Diffuse bronchial wall thickening.  Diffuse centrilobular ground-glass attenuation and micronodularity.  There are a few tiny thick walled cysts, best demonstrated in the left upper lobe on images 19 and 22 of series 5.  Extensive architectural distortion with pleuroparenchymal thickening in the anterior aspect of the lingula and within the lung apices bilaterally (right greater than left), most compatible with chronic post infectious or inflammatory scarring.  No definite bronchiectasis.  No definite subpleural reticulation or frank honeycombing is identified.  Mild centrilobular paraseptal emphysema.  Upper Abdomen: Well defined 1.6 cm low attenuation lesion in segment two of the liver is compatible with a cyst.  Musculoskeletal: There are no aggressive appearing lytic or blastic lesions noted in the visualized portions of the skeleton.  IMPRESSION: 1.  The appearance of the lungs is most strongly suggestive of a severe smoking related disease such as respiratory bronchiolitis (RB), or, given the patient's symptoms, respiratory bronchiolitis interstitial lung disease (RB-ILD).  In addition, however, there are some thick-walled cysts noted,  particularly in the left upper lobe, which could indicate a more ominous process such as pulmonary Langerhans cell histiocytosis.  Counseling for smoking cessation is strongly recommended. 2.  Multiple borderline enlarged mediastinal lymph nodes with a minimally enlarged low left paratracheal lymph node, which are nonspecific, but presumably reactive.  3.  Additional findings, as above.   Original Report Authenticated By: Trudie Reed, M.D.   Dg Chest Port 1 View  01/05/2013   *RADIOLOGY REPORT*  Clinical Data: Pneumonia.  PORTABLE CHEST - 1 VIEW   Comparison: Two-view chest 06/30/2012.  Findings: The heart size is normal.  A diffuse interstitial pattern is evident.  Biapical pleural thickening is stable. Emphysematous changes are again noted.  No focal airspace consolidation is present.  The visualized soft tissues and bony thorax are unremarkable.  IMPRESSION:  1.  Diffuse interstitial and airspace disease likely reflects edema.  Atypical infection is also considered. 2.  Emphysema.   Original Report Authenticated By: Marin Roberts, M.D.    ASSESSMENT / PLAN:  #1 CT scan consistent with combination of bronchiectasis in a pattern compatible with respiratory bronchiolitis interstitial lung disease all related to smoking There is also a mild emphysematous component seen  Plan  Check immunodeficiency labs and IgE level  Check alpha-1 antitrypsin level  Agree with current nebulized therapy and steroid and antibiotic plan of care  Add a flutter valve  Oxygen titration  Dorcas Carrow Beeper  [phone]  Cell  [phone]  If no response or cell goes to voicemail, call beeper [phone]  Pulmonary and Critical Care Medicine [facility] Pager: [phone]  01/06/2013, 12:16 PM

## 2013-01-06 NOTE — Progress Notes (Signed)
Report called to Marchelle Folks, Charity fundraiser.  Patient transferring to room 1420 via wheelchair.   Spouse at the bedside.

## 2013-01-06 NOTE — Progress Notes (Signed)
[facility] PROGRESS NOTE  Doris Obrien XLK:[id number] DOB: 02-Sep-1960 DOA: 01/05/2013 PCP: Pamelia Hoit, MD  Assessment/Plan: Principal Problem:  Acute respiratory failure with hypoxia  - this appears to be mostly secondary to COPD exacerbation,  and component of CHF. Chest CT with ? 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 -I have consulted pulmonology for further recommendations/eval -BNP elevated, continue diuresis with Lasix and await the echo - Continue empiric Zithro and Rocephin for now  - Continue nebulized bronchodilators, begin tapering Solu-Medrol -She is satting well on cannula O2 and remains hemodynamically stable, will transfer to telemetry. Active Problems:  COPD exacerbation  - management as noted above  Leukocytosis, unspecified  - likely secondary to principal problem and administration of solumedrol  - continue ABX as noted above, follow Hypokalemia  - secondary Lasix and beta agonist, albuterol  -  Replace K., follow and recheck   Code Status: Full  Family Communication: Husband at bedside Disposition Plan: to home when medically stable   Consultants:  Pulmonology  Procedures:  Echocardiogram-pending  Antibiotics:  Rocephin and Zithromax started 9/5  HPI/Subjective: Patient states decreased shortness of breath, denies chest pain  Objective: Filed Vitals:   01/06/13 0800  BP: 114/76  Pulse: 99  Temp: 97.5 F (36.4 C)  Resp: 18    Intake/Output Summary (Last 24 hours) at 01/06/13 0916 Last data filed at 01/06/13 0800  Gross per 24 hour  Intake    240 ml  Output   2225 ml  Net  -1985 ml   Filed Weights   01/05/13 1331 01/06/13 0000 01/06/13 0448  Weight: 57.6 kg (126 lb 15.8 oz) 52.5 kg (115 lb 11.9 oz) 52.5 kg (115 lb 11.9 oz)    Exam:  General: alert & oriented x 3 In NAD Cardiovascular: RRR, nl S1 s2 Respiratory: Decreased air  entry bilaterally, no wheezes  Abdomen: soft +BS NT/ND, no masses palpable Extremities: No cyanosis and no edema     Data Reviewed: Basic Metabolic Panel:  Recent Labs Lab 01/05/13 1125 01/06/13 0355  NA 139 133*  K 3.4* 3.0*  CL 96 90*  CO2 31 34*  GLUCOSE 100* 193*  BUN 19 17  CREATININE 0.69 0.78  CALCIUM 9.7 9.8   Liver Function Tests: No results found for this basename: AST, ALT, ALKPHOS, BILITOT, PROT, ALBUMIN,  in the last 168 hours No results found for this basename: LIPASE, AMYLASE,  in the last 168 hours No results found for this basename: AMMONIA,  in the last 168 hours CBC:  Recent Labs Lab 01/05/13 1125 01/06/13 0355  WBC 23.7* 22.8*  NEUTROABS 19.2*  --   HGB 13.4 12.6  HCT 38.8 36.5  MCV 94.6 94.8  PLT 419* 397   Cardiac Enzymes:  Recent Labs Lab 01/05/13 1125  TROPONINI <0.30   BNP (last 3 results)  Recent Labs  01/05/13 1125  PROBNP 2025.0*   CBG: No results found for this basename: GLUCAP,  in the last 168 hours  Recent Results (from the past 240 hour(s))  MRSA PCR SCREENING     Status: None   Collection Time    01/05/13  1:33 PM      Result Value Range Status   MRSA by PCR NEGATIVE  NEGATIVE Final   Comment:            The GeneXpert MRSA Assay (FDA     approved for NASAL specimens  only), is one component of a     comprehensive MRSA colonization     surveillance program. It is not     intended to diagnose MRSA     infection nor to guide or     monitor treatment for     MRSA infections.  CULTURE, EXPECTORATED SPUTUM-ASSESSMENT     Status: None   Collection Time    01/05/13  2:12 PM      Result Value Range Status   Specimen Description SPUTUM   Final   Special Requests NONE   Final   Sputum evaluation     Final   Value: THIS SPECIMEN IS ACCEPTABLE. RESPIRATORY CULTURE REPORT TO FOLLOW.   Report Status 01/05/2013 FINAL   Final     Studies: Ct Chest W Contrast  01/05/2013   *RADIOLOGY REPORT*  Clinical Data:  Hypoxia.  Shortness of breath.  Evaluate for interstitial lung disease.  CT CHEST WITH CONTRAST  Technique:  Multidetector CT imaging of the chest was performed following the standard protocol during bolus administration of intravenous contrast.  Contrast: 80mL OMNIPAQUE IOHEXOL 300 MG/ML  SOLN  Comparison: No priors.  Findings:  Mediastinum: Heart size is normal. There is no significant pericardial fluid, thickening or pericardial calcification.  Mildly enlarged low left paratracheal lymph node measuring 12 mm in short axis.  No other definite pathologically enlarged mediastinal or hilar lymph nodes are otherwise noted, although there are numerous borderline enlarged lymph nodes in the mediastinum.  The esophagus is unremarkable in appearance.  Lungs/Pleura: Diffuse bronchial wall thickening.  Diffuse centrilobular ground-glass attenuation and micronodularity.  There are a few tiny thick walled cysts, best demonstrated in the left upper lobe on images 19 and 22 of series 5.  Extensive architectural distortion with pleuroparenchymal thickening in the anterior aspect of the lingula and within the lung apices bilaterally (right greater than left), most compatible with chronic post infectious or inflammatory scarring.  No definite bronchiectasis.  No definite subpleural reticulation or frank honeycombing is identified.  Mild centrilobular paraseptal emphysema.  Upper Abdomen: Well defined 1.6 cm low attenuation lesion in segment two of the liver is compatible with a cyst.  Musculoskeletal: There are no aggressive appearing lytic or blastic lesions noted in the visualized portions of the skeleton.  IMPRESSION: 1.  The appearance of the lungs is most strongly suggestive of a severe smoking related disease such as respiratory bronchiolitis (RB), or, given the patient's symptoms, respiratory bronchiolitis interstitial lung disease (RB-ILD).  In addition, however, there are some thick-walled cysts noted, particularly in the  left upper lobe, which could indicate a more ominous process such as pulmonary Langerhans cell histiocytosis.  Counseling for smoking cessation is strongly recommended. 2.  Multiple borderline enlarged mediastinal lymph nodes with a minimally enlarged low left paratracheal lymph node, which are nonspecific, but presumably reactive.  3.  Additional findings, as above.   Original Report Authenticated By: Trudie Reed, M.D.   Dg Chest Port 1 View  01/05/2013   *RADIOLOGY REPORT*  Clinical Data: Pneumonia.  PORTABLE CHEST - 1 VIEW  Comparison: Two-view chest 06/30/2012.  Findings: The heart size is normal.  A diffuse interstitial pattern is evident.  Biapical pleural thickening is stable. Emphysematous changes are again noted.  No focal airspace consolidation is present.  The visualized soft tissues and bony thorax are unremarkable.  IMPRESSION:  1.  Diffuse interstitial and airspace disease likely reflects edema.  Atypical infection is also considered. 2.  Emphysema.   Original Report Authenticated By:  Marin Roberts, M.D.    Scheduled Meds: . albuterol  2.5 mg Nebulization Q4H  . azithromycin  500 mg Oral Q24H  . cefTRIAXone (ROCEPHIN)  IV  1 g Intravenous Q24H  . enoxaparin (LOVENOX) injection  40 mg Subcutaneous Q24H  . methylPREDNISolone (SOLU-MEDROL) injection  40 mg Intravenous Q6H  . morphine  30 mg Oral BID  . sertraline  150 mg Oral Daily  . sodium chloride  3 mL Intravenous Q12H   Continuous Infusions:   Principal Problem:   Acute respiratory failure with hypoxia Active Problems:   Hypokalemia   COPD exacerbation   Leukocytosis, unspecified    Time spent: 35    Eyla Tallon C  [facility] Pager [phone]. If 7PM-7AM, please contact night-coverage at [email or web address], password [facility] 01/06/2013, 9:16 AM  LOS: 1 day

## 2013-01-06 NOTE — Progress Notes (Signed)
  Echocardiogram 2D Echocardiogram has been performed.  Doris Obrien 01/06/2013, 10:48 AM

## 2013-01-07 DIAGNOSIS — D72829 Elevated white blood cell count, unspecified: Secondary | ICD-10-CM

## 2013-01-07 LAB — BASIC METABOLIC PANEL
BUN: 16 mg/dL (ref 6–23)
Calcium: 10.1 mg/dL (ref 8.4–10.5)
GFR calc Af Amer: >90 mL/min (ref >90)
GFR calc non Af Amer: >90 mL/min (ref >90)
Glucose, Bld: 162 mg/dL — ABNORMAL HIGH (ref 70–99)
Sodium: 136 mEq/L (ref 135–145)

## 2013-01-07 LAB — CBC
MCH: 33 pg (ref 26.0–34.0)
MCHC: 34.1 g/dL (ref 30.0–36.0)
Platelets: 404 10*3/uL — ABNORMAL HIGH (ref 150–400)

## 2013-01-07 MED ORDER — PREDNISONE 50 MG PO TABS
60.0000 mg | ORAL_TABLET | Freq: Every day | ORAL | Status: DC
  Administered 2013-01-08 – 2013-01-09 (×2): 60 mg via ORAL
  Filled 2013-01-07 (×3): qty 1

## 2013-01-07 MED ORDER — METHYLPREDNISOLONE SODIUM SUCC 40 MG IJ SOLR
40.0000 mg | Freq: Two times a day (BID) | INTRAMUSCULAR | Status: AC
  Administered 2013-01-07 – 2013-01-08 (×2): 40 mg via INTRAVENOUS
  Filled 2013-01-07 (×2): qty 1

## 2013-01-07 NOTE — Progress Notes (Signed)
[facility] PROGRESS NOTE  Doris Obrien:[id number] DOB: April 02, 1961 DOA: 01/05/2013 PCP: Pamelia Hoit, MD  Assessment/Plan: Principal Problem:  Acute respiratory failure with hypoxia  - this appears to be mostly secondary to COPD exacerbation,  and component of diastolic CHF. Chest CT with ? 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 -Continue empiric Zithro and Rocephin for now  -Continue nebulized bronchodilators, taper to prednisone beginning in a.m. -Improving clinically -Will evaluate for home O2 in a.m. and follow Active Problems:  COPD exacerbation  - management as noted above  Diastolic CHF -As discussed above, continue Lasix -2-D echo as above. Leukocytosis, unspecified  - likely secondary to principal problem and administration of solumedrol  - continue ABX as noted above, follow Hypokalemia  - secondary Lasix and beta agonist, albuterol  -  Replace K., follow and recheck   Code Status: Full  Family Communication: Husband at bedside Disposition Plan: to home when medically stable   Consultants:  Pulmonology  Procedures:  Echocardiogram Study Conclusions  Left ventricle: The cavity size was normal. Systolic function was normal. The estimated ejection fraction was in the range of 55% to 60%. Although no diagnostic regional wall motion abnormality was identified, this possibility cannot be completely excluded on the basis of this study. Doppler parameters are consistent with abnormal left ventricular relaxation (grade 1 diastolic dysfunction).    Antibiotics:  Rocephin and Zithromax started 9/5  HPI/Subjective: Denies any new complaints,  breathing better  Objective: Filed Vitals:   01/07/13 0610  BP: 108/72  Pulse: 98  Temp: 97.5 F (36.4 C)  Resp: 18    Intake/Output Summary (Last 24 hours) at 01/07/13 1020 Last data filed at 01/07/13 0132  Gross per 24 hour  Intake    410 ml  Output    800 ml  Net   -390 ml   Filed Weights   01/06/13 0448 01/06/13 1442 01/07/13 0634  Weight: 52.5 kg (115 lb 11.9 oz) 52.5 kg (115 lb 11.9 oz) 57.2 kg (126 lb 1.7 oz)    Exam:  General: alert & oriented x 3 In NAD Cardiovascular: RRR, nl S1 s2 Respiratory: Decreased air entry bilaterally, no wheezes  Abdomen: soft +BS NT/ND, no masses palpable Extremities: No cyanosis and no edema     Data Reviewed: Basic Metabolic Panel:  Recent Labs Lab 01/05/13 1125 01/06/13 0355 01/07/13 0450  NA 139 133* 136  K 3.4* 3.0* 4.5  CL 96 90* 97  CO2 31 34* 33*  GLUCOSE 100* 193* 162*  BUN 19 17 16   CREATININE 0.69 0.78 0.66  CALCIUM 9.7 9.8 10.1   Liver Function Tests: No results found for this basename: AST, ALT, ALKPHOS, BILITOT, PROT, ALBUMIN,  in the last 168 hours No results found for this basename: LIPASE, AMYLASE,  in the last 168 hours No results found for this basename: AMMONIA,  in the last 168 hours CBC:  Recent Labs Lab 01/05/13 1125 01/06/13 0355 01/07/13 0450  WBC 23.7* 22.8* 32.5*  NEUTROABS 19.2*  --   --   HGB 13.4 12.6 12.5  HCT 38.8 36.5 36.7  MCV 94.6 94.8 96.8  PLT 419* 397 404*   Cardiac Enzymes:  Recent Labs Lab 01/05/13 1125  TROPONINI <0.30   BNP (last 3 results)  Recent Labs  01/05/13 1125  PROBNP 2025.0*   CBG: No results found for this basename: GLUCAP,  in the last 168 hours  Recent Results (from the past 240 hour(s))  MRSA PCR SCREENING     Status: None   Collection Time    01/05/13  1:33 PM      Result Value Range Status   MRSA by PCR NEGATIVE  NEGATIVE Final   Comment:            The GeneXpert MRSA Assay (FDA     approved for NASAL specimens     only), is one  component of a     comprehensive MRSA colonization     surveillance program. It is not     intended to diagnose MRSA     infection nor to guide or     monitor treatment for     MRSA infections.  CULTURE, BLOOD (ROUTINE X 2)     Status: None   Collection Time    01/05/13  1:57 PM      Result Value Range Status   Specimen Description BLOOD RIGHT ARM   Final   Special Requests BOTTLES DRAWN AEROBIC AND ANAEROBIC 4CC   Final   Culture  Setup Time     Final   Value: 01/05/2013 22:09     Performed at [facility]   Culture     Final   Value:        BLOOD CULTURE RECEIVED NO GROWTH TO DATE CULTURE WILL BE HELD FOR 5 DAYS BEFORE ISSUING A FINAL NEGATIVE REPORT     Performed at [facility]   Report Status PENDING   Incomplete  CULTURE, BLOOD (ROUTINE X 2)     Status: None   Collection Time    01/05/13  2:03 PM      Result Value Range Status   Specimen Description BLOOD RIGHT HAND   Final   Special Requests BOTTLES DRAWN AEROBIC ONLY 3CC   Final   Culture  Setup Time     Final   Value: 01/05/2013 22:09     Performed at [facility]   Culture     Final   Value:        BLOOD CULTURE RECEIVED NO GROWTH TO DATE CULTURE WILL BE HELD FOR 5 DAYS BEFORE ISSUING A FINAL NEGATIVE REPORT     Performed at [facility]   Report Status PENDING   Incomplete  CULTURE, EXPECTORATED SPUTUM-ASSESSMENT     Status: None   Collection Time    01/05/13  2:12 PM      Result Value Range Status   Specimen Description SPUTUM   Final   Special Requests NONE   Final   Sputum evaluation     Final   Value: THIS SPECIMEN IS ACCEPTABLE. RESPIRATORY CULTURE REPORT TO FOLLOW.   Report Status 01/05/2013 FINAL   Final  CULTURE, RESPIRATORY (NON-EXPECTORATED)     Status: None   Collection Time    01/05/13  2:12 PM      Result Value Range Status   Specimen Description SPUTUM   Final   Special Requests NONE   Final   Gram Stain PENDING   Incomplete   Culture     Final   Value: Culture  reincubated for better growth     Performed at [facility]   Report Status PENDING   Incomplete     Studies: Ct Chest W Contrast  01/05/2013   *RADIOLOGY REPORT*  Clinical Data: Hypoxia.  Shortness of breath.  Evaluate for interstitial lung disease.  CT CHEST WITH CONTRAST  Technique:  Multidetector CT imaging of the chest was performed following the standard protocol during bolus administration of intravenous contrast.  Contrast: 80mL OMNIPAQUE IOHEXOL 300 MG/ML  SOLN  Comparison: No priors.  Findings:  Mediastinum: Heart size is normal. There is no significant pericardial fluid, thickening or pericardial calcification.  Mildly enlarged low left paratracheal lymph node measuring 12 mm in short axis.  No other definite pathologically enlarged mediastinal or hilar lymph nodes are otherwise noted, although there are numerous borderline enlarged lymph nodes in the mediastinum.  The esophagus is unremarkable in appearance.  Lungs/Pleura: Diffuse bronchial wall thickening.  Diffuse centrilobular ground-glass attenuation and micronodularity.  There are a few tiny thick walled cysts, best demonstrated in the left upper lobe on images 19 and 22 of series 5.  Extensive architectural distortion with pleuroparenchymal thickening in the anterior aspect of the lingula and within the lung apices bilaterally (right greater than left), most compatible with chronic post infectious or inflammatory scarring.  No definite bronchiectasis.  No definite subpleural reticulation or frank honeycombing is identified.  Mild centrilobular paraseptal emphysema.  Upper Abdomen: Well defined 1.6 cm low attenuation lesion in segment two of the liver is compatible with a cyst.  Musculoskeletal: There are no aggressive appearing lytic or blastic lesions noted in the visualized portions of the skeleton.  IMPRESSION: 1.  The appearance of the lungs is most strongly suggestive of a severe smoking related disease such as respiratory  bronchiolitis (RB), or, given the patient's symptoms, respiratory bronchiolitis interstitial lung disease (RB-ILD).  In addition, however, there are some thick-walled cysts noted, particularly in the left upper lobe, which could indicate a more ominous process such as pulmonary Langerhans cell histiocytosis.  Counseling for smoking cessation is strongly recommended. 2.  Multiple borderline enlarged mediastinal lymph nodes with a minimally enlarged low left paratracheal lymph node, which are nonspecific, but presumably reactive.  3.  Additional findings, as above.   Original Report Authenticated By: Trudie Reed, M.D.   Dg Chest Port 1 View  01/05/2013   *RADIOLOGY REPORT*  Clinical Data: Pneumonia.  PORTABLE CHEST - 1 VIEW  Comparison: Two-view chest 06/30/2012.  Findings: The heart size is normal.  A diffuse interstitial pattern is evident.  Biapical pleural thickening is stable. Emphysematous changes are again noted.  No focal airspace consolidation is present.  The visualized soft tissues and bony thorax are unremarkable.  IMPRESSION:  1.  Diffuse interstitial and airspace disease likely reflects edema.  Atypical infection is also considered. 2.  Emphysema.   Original Report Authenticated By: Marin Roberts, M.D.    Scheduled Meds: . albuterol  2.5 mg Nebulization Q4H  . azithromycin  500 mg Oral Q24H  . cefTRIAXone (ROCEPHIN)  IV  1 g Intravenous Q24H  . enoxaparin (LOVENOX) injection  40 mg Subcutaneous Q24H  . methylPREDNISolone (SOLU-MEDROL) injection  40 mg Intravenous Q12H  . mometasone-formoterol  2 puff Inhalation BID  . morphine  30 mg Oral BID  . sertraline  150 mg Oral Daily  . sodium chloride  3 mL Intravenous Q12H   Continuous Infusions:   Principal Problem:   Acute respiratory failure with hypoxia Active Problems:   Hypokalemia   COPD exacerbation   Leukocytosis, unspecified   Respiratory bronchiolitis interstitial lung disease   Bronchiectasis    Time spent:  25    [facility]  [facility] Pager [phone]. If 7PM-7AM, please  contact night-coverage at [email or web address], password [facility] 01/07/2013, 10:20 AM  LOS: 2 days

## 2013-01-07 NOTE — Progress Notes (Addendum)
PULMONARY  / CRITICAL CARE MEDICINE  Name: DAKISHA SCHOOF MRN: [id number] DOB: 1961-05-01    ADMISSION DATE:  01/05/2013 CONSULTATION DATE:  01/07/2013   REFERRING MD :  Izola Price  PRIMARY SERVICE:  TRH  CHIEF COMPLAINT:   Abnormal CT scan, acute bronchitis  BRIEF HX 52yo WF with bronchiectasis and prob RB ILD  SIGNIFICANT EVENTS / STUDIES:    LINES / TUBES: PIV  CULTURES: Respiratory culture 01/05/2013: Blood culture 01/05/2013  ANTIBIOTICS: Azithromycin 01/05/2013 Rocephin 01/05/2013  SUBJECTIVE:  The patient states she is improved from admission Had productive cough this am, using flutter valve  VITAL SIGNS: Temp:  [97.4 F (36.3 C)-97.8 F (36.6 C)] 97.5 F (36.4 C) (09/07 0610) Pulse Rate:  [98-107] 98 (09/07 0610) Resp:  [16-18] 18 (09/07 0610) BP: (108-124)/(72-88) 108/72 mmHg (09/07 0610) SpO2:  [90 %-97 %] 90 % (09/07 0610) FiO2 (%):  [75 %] 75 % (09/06 1138) Weight:  [52.5 kg (115 lb 11.9 oz)-57.2 kg (126 lb 1.7 oz)] 57.2 kg (126 lb 1.7 oz) (09/07 4098)  PHYSICAL EXAMINATION: General:  Well-developed white female in no distress Neuro:  Awake and alert with no focal deficits HEENT:  No jugular venous distention no thyromegaly moist mucous membranes Cardiovascular:  Regular rate and rhythm normal S1-S2 no murmur rub heave or gallop Lungs:  Distant breath sounds with improved airflow Abdomen:  Soft nontender bowel sounds active no organomegaly Musculoskeletal:  Full range of motion no joint deformity Skin:  Clear   Recent Labs Lab 01/05/13 1125 01/06/13 0355 01/07/13 0450  NA 139 133* 136  K 3.4* 3.0* 4.5  CL 96 90* 97  CO2 31 34* 33*  BUN 19 17 16   CREATININE 0.69 0.78 0.66  GLUCOSE 100* 193* 162*    Recent Labs Lab 01/05/13 1125 01/06/13 0355 01/07/13 0450  HGB 13.4 12.6 12.5  HCT 38.8 36.5 36.7  WBC 23.7* 22.8* 32.5*  PLT 419* 397 404*    ASSESSMENT / PLAN:  Principal Problem:   Acute respiratory failure with hypoxia Active  Problems:   Hypokalemia   COPD exacerbation   Leukocytosis, unspecified   Respiratory bronchiolitis interstitial lung disease   Bronchiectasis   #1 CT scan consistent with combination of bronchiectasis in a pattern compatible with respiratory bronchiolitis interstitial lung disease all related to smoking There is also a mild emphysematous component seen IgE mildly elevated  Plan  Cont BDs  Cont ABX and Steroids>>switch to po prednisone  Dulera 200 one puff bid (or symbicort 160 1 puff bid, whatever pt insurance   will cover  Oxygen (prob will need home O2)  Flutter  Will need f/u with pulm on d/c and outpt PFTs   Dorcas Carrow Beeper  [phone]  Cell  [phone]  If no response or cell goes to voicemail, call beeper [phone]  Pulmonary and Critical Care Medicine [facility] Pager: [phone]  01/07/2013, 9:22 AM

## 2013-01-08 LAB — CULTURE, RESPIRATORY W GRAM STAIN

## 2013-01-08 LAB — CBC
Platelets: 434 10*3/uL — ABNORMAL HIGH (ref 150–400)
RDW: 14 % (ref 11.5–15.5)
WBC: 32.2 10*3/uL — ABNORMAL HIGH (ref 4.0–10.5)

## 2013-01-08 LAB — BASIC METABOLIC PANEL
Chloride: 96 mEq/L (ref 96–112)
GFR calc Af Amer: >90 mL/min (ref >90)
Potassium: 4.9 mEq/L (ref 3.5–5.1)
Sodium: 138 mEq/L (ref 135–145)

## 2013-01-08 MED ORDER — POTASSIUM CHLORIDE ER 10 MEQ PO TBCR: 10.0000 meq | EXTENDED_RELEASE_TABLET | Freq: Every day | ORAL | Status: DC

## 2013-01-08 MED ORDER — FUROSEMIDE 20 MG PO TABS: 20.0000 mg | ORAL_TABLET | Freq: Every day | ORAL | Status: DC

## 2013-01-08 MED ORDER — MOMETASONE FURO-FORMOTEROL FUM 200-5 MCG/ACT IN AERO: 2.0000 | INHALATION_SPRAY | Freq: Two times a day (BID) | RESPIRATORY_TRACT | Status: AC

## 2013-01-08 MED ORDER — AZITHROMYCIN 500 MG PO TABS: 500.0000 mg | ORAL_TABLET | Freq: Every day | ORAL | Status: DC

## 2013-01-08 MED ORDER — PREDNISONE (PAK) 10 MG PO TABS: ORAL_TABLET | ORAL | Status: DC

## 2013-01-08 MED ORDER — CEFUROXIME AXETIL 500 MG PO TABS: 500.0000 mg | ORAL_TABLET | Freq: Two times a day (BID) | ORAL | Status: DC

## 2013-01-08 NOTE — Care Management Note (Addendum)
    Page 1 of 2   01/09/2013     11:18:36 AM   CARE MANAGEMENT NOTE 01/09/2013  Patient:  Doris Obrien, Doris Obrien   Account Number:  [id number]  Date Initiated:  01/05/2013  Documentation initiated by:  DAVIS,RHONDA  Subjective/Objective Assessment:   pt with hx of resp problems sent from [facility] after failed outpt tz of copd excerbation     Action/Plan:   home when stable   Anticipated DC Date:  01/09/2013   Anticipated DC Plan:  HOME/SELF CARE  In-house referral  NA      DC Planning Services  NA      PAC Choice  NA   Choice offered to / List presented to:  NA   DME arranged  OXYGEN      DME agency  [facility]     HH arranged  NA      HH agency  NA   Status of service:  Completed, signed off Medicare Important Message given?  NA - LOS <3 / Initial given by admissions (If response is "NO", the following Medicare IM given date fields will be blank) Date Medicare IM given:   Date Additional Medicare IM given:    Discharge Disposition:  HOME/SELF CARE  Per UR Regulation:  Reviewed for med. necessity/level of care/duration of stay  If discussed at Long Length of Stay Meetings, dates discussed:    Comments:  01/09/13 Doris Wakefield RN,BSN NCM [phone] INFORMED THIS MORNING FROM ON CALL CM THAT SHE RECEIVED A CALL FOR HOME 02 LAST NIGHT.[facility] DME UNABLE TO PROVIDE HOME 02 THAT LATE.[facility] INFORMED THIS MORNING ABOUT HOME 02 ORDER.HOME 02 DELIVERED TO RM.  01/08/13 Doris Gamel RN,BSN NCM [phone] TRANSFER FROM SDU.QUALIFIES FOR HOME 02.IF MD AGREE WILL NEED HOME 02 ORDER W/LITER FLOW & DURATION.  [id number] Doris Plater, RN, BSN, [place] [phone] Chart Reviewed for discharge and hospital needs. Discharge needs at time of review:  None/ patient is presently not on home 02 may require intiation upon dc. Review of patient progress due on [id number].

## 2013-01-08 NOTE — Progress Notes (Signed)
Pt 02 sats while sitting on the side of the bed without activity is81% Room, air.  While ambulating without 02 pt 02 sats dropped to 71% Room air. Without activity and on 02@ 2l/min [place] her sats are 94%.

## 2013-01-08 NOTE — Discharge Summary (Addendum)
Physician Discharge Summary  Doris Obrien ZOX:[id number] DOB: 10/04/1960 DOA: 01/05/2013  PCP: Pamelia Hoit, MD  Admit date: 01/05/2013 Discharge date: 01/08/2013  Time spent: >30 minutes  Recommendations for Outpatient Follow-up:  Follow-up Information   Follow up with Shan Levans, MD On 01/16/2013. (9-30 am)    Specialty:  Pulmonary Disease   Contact information:   [address] [place] [address] [phone]       Follow up with Pamelia Hoit, MD. (in 1-2weeks, call for appt upon discharge)    Specialty:  Family Medicine   Contact information:   [address] [place] [address] [phone]        Discharge Diagnoses:  Principal Problem:   Acute respiratory failure with hypoxia Active Problems:   Hypokalemia   COPD exacerbation   Leukocytosis, unspecified   Respiratory bronchiolitis interstitial lung disease   Bronchiectasis  Discharge Condition: improved/stable  Diet recommendation: Regular  Filed Weights   01/06/13 1442 01/07/13 0634 01/08/13 0619  Weight: 52.5 kg (115 lb 11.9 oz) 57.2 kg (126 lb 1.7 oz) 57.879 kg (127 lb 9.6 oz)    History of present illness:  Pt is 52 yo female with known history of COPD, current smoker 1 ppd, who was seen earlier by her PCP for progressively worsening shortness of breath that she initially noted 1 week prior to this admission and present with exertion only. This has gotten worse and now present at rest as well, associated with mostly non productive cough, subjective fevers, chills, malaise. Pt denies similar episodes in the past that were caused by COPD flare, she is not on oxygen at home. Her PCP noted oxygen saturation in mid 80's on RA and has noted significant wheezing and mild respiratory distress. She was referred for an admission to [facility].  In ED, pt found to be hypoxic with O2 sat as low as 60% on RA but has responded well to nebulizer treatment, solumedrol, oxygen via [place]. [facility] asked to admit to SDU for  evaluation and management of what appears to be COPD flare.    Hospital Course:  Acute respiratory failure with hypoxia  -As discussed above, upon admission the impression was that this likely secondary to COPD exacerbation, and component of diastolic CHF. A Chest CT was done and showed respiratory bronchiolitis interstitial lung disease (RB-ILD)with ? 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 -Pt was placed on nebs, abx and solumedrol on admission and was given lasix as well as her bnp>2000 -pulmonary was consulted following above CT and Dr Lynelle Doctor impression was that he had combination of bronchiectasis and pattern compatible with respiratory bronchiolitis/ILD and he agreed with treatment as above with abx, steroids and bronchodilators-recommended Dulera vs symbicort -Pt had echo and it showed EF 55-60% and grade 1 diastolic., she will be dc'ed on Po lasix and is to follow up with PCP  Pt improved clinically and solumedrol was tapered to prednisone beginning today and I discussed pt with Dr Vassie Loll and from his standpoint ok for DC.  -she was evaluated for home O2 and meets criteria, she desats to 81% Room, air., CM to set up home O2 -She is medically ready for DC and is to follow up with Dr Delford Field as directed and with PCP  Active Problems:  COPD exacerbation  - management as noted above  Diastolic CHF  -As discussed above, continue Lasix low dose on discharge.  -2-D echo as above with  grade 1diastolic dysfunction and nl EF.  Leukocytosis, unspecified  - likely secondary to principal problem and administration of steroids - continue ABX as noted above. She has improved clinically and remains afebrile and hemodynamically stable  Hypokalemia  - secondary Lasix and beta agonist, albuterol  -  K.repleted, follow up with PCP.   Procedures:  Echo  Study Conclusions  Left ventricle: The cavity size was  normal. Systolic function was normal. The estimated ejection fraction was in the range of 55% to 60%. Although no diagnostic regional wall motion abnormality was identified, this possibility cannot be completely excluded on the basis of this study. Doppler parameters are consistent with abnormal left ventricular relaxation (grade 1 diastolic dysfunction).    Consultations: pulmonology  Discharge Exam: Filed Vitals:   01/08/13 1410  BP: 120/77  Pulse: 87  Temp: 97.6 F (36.4 C)  Resp: 20   Exam:  General: alert & oriented x 3 In NAD  Cardiovascular: RRR, nl S1 s2  Respiratory: Decreased air entry bilaterally, no wheezes  Abdomen: soft +BS NT/ND, no masses palpable  Extremities: No cyanosis and no edema   Discharge Instructions  Discharge Orders   Future Appointments Provider Department Dept Phone   01/16/2013 9:30 AM Storm Frisk, MD [facility] [phone]   Future Orders Complete By Expires   Diet general  As directed    Discharge instructions  As directed    Comments:     Continuous Home O2 at 2L [place]   Increase activity slowly  As directed        Medication List         alprazolam 2 MG tablet  Commonly known as:  XANAX  Take 2 mg by mouth 3 (three) times daily as needed for anxiety.     azithromycin 500 MG tablet  Commonly known as:  ZITHROMAX  Take 1 tablet (500 mg total) by mouth daily.     cefUROXime 500 MG tablet  Commonly known as:  CEFTIN  Take 1 tablet (500 mg total) by mouth 2 (two) times daily.     furosemide 20 MG tablet  Commonly known as:  LASIX  Take 1 tablet (20 mg total) by mouth daily.     HYDROcodone-acetaminophen 10-325 MG per tablet  Commonly known as:  NORCO  Take 1 tablet by mouth every 6 (six) hours as needed for pain.     mometasone-formoterol 200-5 MCG/ACT Aero  Commonly known as:  DULERA  Inhale 2 puffs into the lungs 2 (two) times daily.     morphine 30 MG 12 hr tablet  Commonly known as:  MS CONTIN  Take  30 mg by mouth 2 (two) times daily.     potassium chloride 10 MEQ tablet  Commonly known as:  K-DUR  Take 1 tablet (10 mEq total) by mouth daily.     predniSONE 10 MG tablet  Commonly known as:  STERAPRED UNI-PAK  Take 5tabs/day for 5days, then 4tabs per day till follow with Dr Delford Field on 9/16. Take with food.     sertraline 100 MG tablet  Commonly known as:  ZOLOFT  Take 150 mg by mouth daily.       No Known Allergies     Follow-up Information   Follow up with Shan Levans, MD On 01/16/2013. (9-30 am)    Specialty:  Pulmonary Disease   Contact information:   [address] [place] [address] [phone]       Follow up with Pamelia Hoit,  MD. (in 1-2weeks, call for appt upon discharge)    Specialty:  Family Medicine   Contact information:   [address] [place] [address] [phone]        The results of significant diagnostics from this hospitalization (including imaging, microbiology, ancillary and laboratory) are listed below for reference.    Significant Diagnostic Studies: Ct Chest W Contrast  01/05/2013   *RADIOLOGY REPORT*  Clinical Data: Hypoxia.  Shortness of breath.  Evaluate for interstitial lung disease.  CT CHEST WITH CONTRAST  Technique:  Multidetector CT imaging of the chest was performed following the standard protocol during bolus administration of intravenous contrast.  Contrast: 80mL OMNIPAQUE IOHEXOL 300 MG/ML  SOLN  Comparison: No priors.  Findings:  Mediastinum: Heart size is normal. There is no significant pericardial fluid, thickening or pericardial calcification.  Mildly enlarged low left paratracheal lymph node measuring 12 mm in short axis.  No other definite pathologically enlarged mediastinal or hilar lymph nodes are otherwise noted, although there are numerous borderline enlarged lymph nodes in the mediastinum.  The esophagus is unremarkable in appearance.  Lungs/Pleura: Diffuse bronchial wall thickening.  Diffuse centrilobular  ground-glass attenuation and micronodularity.  There are a few tiny thick walled cysts, best demonstrated in the left upper lobe on images 19 and 22 of series 5.  Extensive architectural distortion with pleuroparenchymal thickening in the anterior aspect of the lingula and within the lung apices bilaterally (right greater than left), most compatible with chronic post infectious or inflammatory scarring.  No definite bronchiectasis.  No definite subpleural reticulation or frank honeycombing is identified.  Mild centrilobular paraseptal emphysema.  Upper Abdomen: Well defined 1.6 cm low attenuation lesion in segment two of the liver is compatible with a cyst.  Musculoskeletal: There are no aggressive appearing lytic or blastic lesions noted in the visualized portions of the skeleton.  IMPRESSION: 1.  The appearance of the lungs is most strongly suggestive of a severe smoking related disease such as respiratory bronchiolitis (RB), or, given the patient's symptoms, respiratory bronchiolitis interstitial lung disease (RB-ILD).  In addition, however, there are some thick-walled cysts noted, particularly in the left upper lobe, which could indicate a more ominous process such as pulmonary Langerhans cell histiocytosis.  Counseling for smoking cessation is strongly recommended. 2.  Multiple borderline enlarged mediastinal lymph nodes with a minimally enlarged low left paratracheal lymph node, which are nonspecific, but presumably reactive.  3.  Additional findings, as above.   Original Report Authenticated By: Trudie Reed, M.D.   Dg Chest Port 1 View  01/05/2013   *RADIOLOGY REPORT*  Clinical Data: Pneumonia.  PORTABLE CHEST - 1 VIEW  Comparison: Two-view chest 06/30/2012.  Findings: The heart size is normal.  A diffuse interstitial pattern is evident.  Biapical pleural thickening is stable. Emphysematous changes are again noted.  No focal airspace consolidation is present.  The visualized soft tissues and bony thorax  are unremarkable.  IMPRESSION:  1.  Diffuse interstitial and airspace disease likely reflects edema.  Atypical infection is also considered. 2.  Emphysema.   Original Report Authenticated By: Marin Roberts, M.D.    Microbiology: Recent Results (from the past 240 hour(s))  MRSA PCR SCREENING     Status: None   Collection Time    01/05/13  1:33 PM      Result Value Range Status   MRSA by PCR NEGATIVE  NEGATIVE Final   Comment:            The GeneXpert MRSA Assay (FDA  approved for NASAL specimens     only), is one component of a     comprehensive MRSA colonization     surveillance program. It is not     intended to diagnose MRSA     infection nor to guide or     monitor treatment for     MRSA infections.  CULTURE, BLOOD (ROUTINE X 2)     Status: None   Collection Time    01/05/13  1:57 PM      Result Value Range Status   Specimen Description BLOOD RIGHT ARM   Final   Special Requests BOTTLES DRAWN AEROBIC AND ANAEROBIC 4CC   Final   Culture  Setup Time     Final   Value: 01/05/2013 22:09     Performed at [facility]   Culture     Final   Value:        BLOOD CULTURE RECEIVED NO GROWTH TO DATE CULTURE WILL BE HELD FOR 5 DAYS BEFORE ISSUING A FINAL NEGATIVE REPORT     Performed at [facility]   Report Status PENDING   Incomplete  CULTURE, BLOOD (ROUTINE X 2)     Status: None   Collection Time    01/05/13  2:03 PM      Result Value Range Status   Specimen Description BLOOD RIGHT HAND   Final   Special Requests BOTTLES DRAWN AEROBIC ONLY 3CC   Final   Culture  Setup Time     Final   Value: 01/05/2013 22:09     Performed at [facility]   Culture     Final   Value: HAEMOPHILUS INFLUENZAE     Note: BETA LACTAMASE NEGATIVE     Note: Gram Stain Report Called to,Read Back By and Verified With: JESSICA CONRAD 01/08/13  1251 BY SMITHERSJ     Performed at [facility]   Report Status PENDING   Incomplete  CULTURE, EXPECTORATED  SPUTUM-ASSESSMENT     Status: None   Collection Time    01/05/13  2:12 PM      Result Value Range Status   Specimen Description SPUTUM   Final   Special Requests NONE   Final   Sputum evaluation     Final   Value: THIS SPECIMEN IS ACCEPTABLE. RESPIRATORY CULTURE REPORT TO FOLLOW.   Report Status 01/05/2013 FINAL   Final  CULTURE, RESPIRATORY (NON-EXPECTORATED)     Status: None   Collection Time    01/05/13  2:12 PM      Result Value Range Status   Specimen Description SPUTUM   Final   Special Requests NONE   Final   Gram Stain     Final   Value: ABUNDANT WBC PRESENT, PREDOMINANTLY PMN     NO SQUAMOUS EPITHELIAL CELLS SEEN     MODERATE GRAM NEGATIVE COCCOBACILLI     FEW GRAM POSITIVE COCCI     IN PAIRS   Culture     Final   Value: ABUNDANT HAEMOPHILUS INFLUENZAE     Note: BETA LACTAMASE NEGATIVE     Performed at [facility]   Report Status 01/08/2013 FINAL   Final     Labs: Basic Metabolic Panel:  Recent Labs Lab 01/05/13 1125 01/06/13 0355 01/07/13 0450 01/08/13 0420  NA 139 133* 136 138  K 3.4* 3.0* 4.5 4.9  CL 96 90* 97 96  CO2 31 34* 33* 35*  GLUCOSE 100* 193* 162* 142*  BUN 19 17 16  16  CREATININE 0.69 0.78 0.66 0.66  CALCIUM 9.7 9.8 10.1 9.9   Liver Function Tests: No results found for this basename: AST, ALT, ALKPHOS, BILITOT, PROT, ALBUMIN,  in the last 168 hours No results found for this basename: LIPASE, AMYLASE,  in the last 168 hours No results found for this basename: AMMONIA,  in the last 168 hours CBC:  Recent Labs Lab 01/05/13 1125 01/06/13 0355 01/07/13 0450 01/08/13 0420  WBC 23.7* 22.8* 32.5* 32.2*  NEUTROABS 19.2*  --   --   --   HGB 13.4 12.6 12.5 13.3  HCT 38.8 36.5 36.7 39.7  MCV 94.6 94.8 96.8 99.0  PLT 419* 397 404* 434*   Cardiac Enzymes:  Recent Labs Lab 01/05/13 1125  TROPONINI <0.30   BNP: BNP (last 3 results)  Recent Labs  01/05/13 1125  PROBNP 2025.0*   CBG: No results found for this basename:  GLUCAP,  in the last 168 hours     Signed:  Ho Parisi C  [facility] 01/08/2013, 5:23 PM

## 2013-01-08 NOTE — Progress Notes (Addendum)
PULMONARY  / CRITICAL CARE MEDICINE  Name: Doris Obrien MRN: [id number] DOB: 06-12-1960    ADMISSION DATE:  01/05/2013 CONSULTATION DATE:  01/08/2013   REFERRING MD :  Izola Price  PRIMARY SERVICE:  [facility]  CHIEF COMPLAINT:   Abnormal CT scan, acute bronchitis  BRIEF HX 52yo WF with bronchiectasis and prob RB ILD  SIGNIFICANT EVENTS / STUDIES:    LINES / TUBES: PIV  CULTURES: Respiratory culture 01/05/2013: Blood culture 01/05/2013>> ng  ANTIBIOTICS: Azithromycin 01/05/2013 Rocephin 01/05/2013  SUBJECTIVE:  Green phlegm Afebrile Breathing improved  VITAL SIGNS: Temp:  [97.4 F (36.3 C)-98 F (36.7 C)] 97.4 F (36.3 C) (09/08 0619) Pulse Rate:  [83-89] 83 (09/08 0619) Resp:  [16-17] 16 (09/08 0619) BP: (108-120)/(74-78) 108/74 mmHg (09/08 0619) SpO2:  [94 %-98 %] 98 % (09/08 0835) Weight:  [57.879 kg (127 lb 9.6 oz)] 57.879 kg (127 lb 9.6 oz) (09/08 0619)  PHYSICAL EXAMINATION: General:  Well-developed white female in no distress Neuro:  Awake and alert with no focal deficits HEENT:  No jugular venous distention no thyromegaly moist mucous membranes Cardiovascular:  Regular rate and rhythm normal S1-S2 no murmur rub heave or gallop Lungs:  Distant breath sounds with improved airflow Abdomen:  Soft nontender bowel sounds active no organomegaly Musculoskeletal:  Full range of motion no joint deformity Skin:  Clear   Recent Labs Lab 01/06/13 0355 01/07/13 0450 01/08/13 0420  NA 133* 136 138  K 3.0* 4.5 4.9  CL 90* 97 96  CO2 34* 33* 35*  BUN 17 16 16   CREATININE 0.78 0.66 0.66  GLUCOSE 193* 162* 142*    Recent Labs Lab 01/06/13 0355 01/07/13 0450 01/08/13 0420  HGB 12.6 12.5 13.3  HCT 36.5 36.7 39.7  WBC 22.8* 32.5* 32.2*  PLT 397 404* 434*    ASSESSMENT / PLAN:  Principal Problem:   Acute respiratory failure with hypoxia Active Problems:   Hypokalemia   COPD exacerbation   Leukocytosis, unspecified   Respiratory bronchiolitis interstitial  lung disease   Bronchiectasis   #1 CT scan consistent with combination of bronchiectasis in a pattern compatible with respiratory bronchiolitis interstitial lung disease all related to smoking There is also a mild emphysematous component seen IgE mildly elevated  Plan  Cont BDs  Cont ABX x 7ds total and  po prednisone  Dulera 200 one puff bid (or symbicort 160 1 puff bid, whatever pt insurance   will cover  Oxygen assessment (prob will need home O2)  Flutter  Arranged  f/u with pulm on d/c and outpt PFTs in a few weeks once acute issues resolved             Smoking cessation emphasized - she has tried quit aids like nicotine patch, chantix in the past   [facility] V.MD [phone] 01/08/2013, 12:16 PM

## 2013-01-09 LAB — IGG, IGA, IGM
IgA: 416 mg/dL — ABNORMAL HIGH (ref 69–380)
IgM, Serum: 163 mg/dL (ref 52–322)

## 2013-01-09 LAB — ALPHA-1-ANTITRYPSIN: A-1 Antitrypsin, Ser: 296 mg/dL — ABNORMAL HIGH (ref 90–200)

## 2013-01-09 NOTE — Progress Notes (Signed)
[facility]  [facility] is providing the following services: Oxygen  If patient discharges after hours, please call [phone].   Renard Hamper 01/09/2013, 8:49 AM

## 2013-01-09 NOTE — Progress Notes (Signed)
Doctor called night RN wondering why patient had not been discharged. Patient did not have home O2 set up yet, so RN called on call case manager to see if it could be set up tonight. Case manager said it could not be and that she would let the day case manager know. Doctor was then notified of the situation and patient will be staying over night and leave tomorrow after home oxygen set up.

## 2013-01-11 LAB — CULTURE, BLOOD (ROUTINE X 2)

## 2013-01-15 ENCOUNTER — Telehealth: Payer: Self-pay | Admitting: Critical Care Medicine

## 2013-01-15 NOTE — Telephone Encounter (Signed)
Spoke with receptionist @ [facility] Fax # verified  Will forward to [employer] so she is aware-- OV notes to be sent to Dr. Andrey Campanile [phone]

## 2013-01-16 ENCOUNTER — Encounter: Payer: Self-pay | Admitting: Critical Care Medicine

## 2013-01-16 ENCOUNTER — Other Ambulatory Visit (INDEPENDENT_AMBULATORY_CARE_PROVIDER_SITE_OTHER): Payer: Medicaid Other

## 2013-01-16 ENCOUNTER — Ambulatory Visit (INDEPENDENT_AMBULATORY_CARE_PROVIDER_SITE_OTHER): Payer: Medicaid Other | Admitting: Critical Care Medicine

## 2013-01-16 DIAGNOSIS — J84115 Respiratory bronchiolitis interstitial lung disease: Secondary | ICD-10-CM

## 2013-01-16 DIAGNOSIS — F17200 Nicotine dependence, unspecified, uncomplicated: Secondary | ICD-10-CM | POA: Insufficient documentation

## 2013-01-16 DIAGNOSIS — E876 Hypokalemia: Secondary | ICD-10-CM

## 2013-01-16 DIAGNOSIS — J441 Chronic obstructive pulmonary disease with (acute) exacerbation: Secondary | ICD-10-CM

## 2013-01-16 LAB — CBC WITH DIFFERENTIAL/PLATELET
Basophils Relative: 0.3 % (ref 0.0–3.0)
Eosinophils Relative: 1.2 % (ref 0.0–5.0)
HCT: 38.2 % (ref 36.0–46.0)
Hemoglobin: 12.8 g/dL (ref 12.0–15.0)
Lymphs Abs: 3.2 10*3/uL (ref 0.7–4.0)
MCV: 96 fl (ref 78.0–100.0)
Monocytes Absolute: 1.3 10*3/uL — ABNORMAL HIGH (ref 0.1–1.0)
Neutro Abs: 8 10*3/uL — ABNORMAL HIGH (ref 1.4–7.7)
RBC: 3.98 Mil/uL (ref 3.87–5.11)
WBC: 12.8 10*3/uL — ABNORMAL HIGH (ref 4.5–10.5)

## 2013-01-16 LAB — BASIC METABOLIC PANEL
BUN: 15 mg/dL (ref 6–23)
Chloride: 101 mEq/L (ref 96–112)
Potassium: 3.1 mEq/L — ABNORMAL LOW (ref 3.5–5.1)
Sodium: 141 mEq/L (ref 135–145)

## 2013-01-16 NOTE — Patient Instructions (Addendum)
Stop oxygen, will call [facility] to pick up Stay off cigarettes Stay on Dulera Finish prednisone Obtain full lung function testing You deferred pneumovax and flu vaccine we recommended We will sign you up for [facility] education program on Copd and smoking cessation Stop lasix and potassium LAbs today CBC, bmet Return 3 months

## 2013-01-16 NOTE — Progress Notes (Signed)
Subjective:    Patient ID: Doris Obrien, female    DOB: 02-05-1961, 52 y.o.   MRN: [id number]  HPI 52 y.o.F Seen for post hosp visit. Dx RBILD. Smoker. Adm with steroids /BD/ABX.  W/u neg for Alpha one AT def. Ig levels normal.  D/c on abx/steroids.  Not much cough.  No wheezing.  Cigarettes: few puffs only. No mucus. No chest pain. Dyspnea is better.   No fever Sputum pos for H Flu, Rx Ceftin and finished. Refuses flu vaccine and pneumovax  Past Medical History  Diagnosis Date  . Chronic pain   . Pneumonia   . COPD (chronic obstructive pulmonary disease)   . Anxiety   . Allergic rhinitis   . Lumbar spondylosis   . Respiratory bronchiolitis interstitial lung disease      Family History  Problem Relation Age of Onset  . Depression Other   . Diabetes Other      History   Social History  . Marital Status: Married    Spouse Name: N/A    Number of Children: N/A  . Years of Education: N/A   Occupational History  . Not on file.   Social History Main Topics  . Smoking status: Former Smoker -- 1.00 packs/day for 40 years    Types: Cigarettes    Quit date: 01/08/2013  . Smokeless tobacco: Never Used  . Alcohol Use: No  . Drug Use: No  . Sexual Activity: No   Other Topics Concern  . Not on file   Social History Narrative  . No narrative on file     No Known Allergies   Outpatient Prescriptions Prior to Visit  Medication Sig Dispense Refill  . alprazolam (XANAX) 2 MG tablet Take 2 mg by mouth 3 (three) times daily as needed for anxiety.      Marland Kitchen HYDROcodone-acetaminophen (NORCO) 10-325 MG per tablet Take 1 tablet by mouth every 6 (six) hours as needed for pain.      . mometasone-formoterol (DULERA) 200-5 MCG/ACT AERO Inhale 2 puffs into the lungs 2 (two) times daily.  8.8 g  0  . morphine (MS CONTIN) 30 MG 12 hr tablet Take 30 mg by mouth 2 (two) times daily.      . predniSONE (STERAPRED UNI-PAK) 10 MG tablet Take 5tabs/day for 5days, then 4tabs per day till  follow with Dr Delford Field on 9/16. Take with food.  38 tablet  0  . sertraline (ZOLOFT) 100 MG tablet Take 150 mg by mouth daily.      . furosemide (LASIX) 20 MG tablet Take 1 tablet (20 mg total) by mouth daily.  30 tablet  o  . potassium chloride (K-DUR) 10 MEQ tablet Take 1 tablet (10 mEq total) by mouth daily.  30 tablet  0  . azithromycin (ZITHROMAX) 500 MG tablet Take 1 tablet (500 mg total) by mouth daily.  1 tablet  0  . cefUROXime (CEFTIN) 500 MG tablet Take 1 tablet (500 mg total) by mouth 2 (two) times daily.  12 tablet  0   No facility-administered medications prior to visit.       Review of Systems Constitutional:   No  weight loss, night sweats,  Fevers, chills, fatigue, lassitude. HEENT:   No headaches,  Difficulty swallowing,  Tooth/dental problems,  Sore throat,                No sneezing, itching, ear ache, nasal congestion, post nasal drip,   CV:  No chest pain,  Orthopnea,  PND, swelling in lower extremities, anasarca, dizziness, palpitations  GI  No heartburn, indigestion, abdominal pain, nausea, vomiting, diarrhea, change in bowel habits, loss of appetite  Resp: No shortness of breath with exertion or at rest.  No excess mucus, no productive cough,  No non-productive cough,  No coughing up of blood.  No change in color of mucus.  No wheezing.  No chest wall deformity  Skin: no rash or lesions.  GU: no dysuria, change in color of urine, no urgency or frequency.  No flank pain.  MS:  No joint pain or swelling.  No decreased range of motion.  No back pain.  Psych:  No change in mood or affect. No depression or anxiety.  No memory loss.     Objective:   Physical Exam Filed Vitals:   01/16/13 0935  BP: 100/66  Pulse: 72  Temp: 98.4 F (36.9 C)  TempSrc: Oral  Height: 5\' 4"  (1.626 m)  Weight: 128 lb (58.06 kg)  SpO2: 96%    Gen: Pleasant, well-nourished, in no distress,  normal affect  ENT: No lesions,  mouth clear,  oropharynx clear, no postnasal  drip  Neck: No JVD, no TMG, no carotid bruits  Lungs: No use of accessory muscles, no dullness to percussion, distant BS  Cardiovascular: RRR, heart sounds normal, no murmur or gallops, no peripheral edema  Abdomen: soft and NT, no HSM,  BS normal  Musculoskeletal: No deformities, no cyanosis or clubbing  Neuro: alert, non focal  Skin: Warm, no lesions or rashes  No results found.        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 Alpha one AT normal Ig G M A normal IgE mild elevation Improved Smoking cessation key Plan Smoking cessation>>26min intervention given, no NRT needed Cont inhaled meds No long needs oxygen >>D/C  Needs PFTs  Updated Medication List Outpatient Encounter Prescriptions as of 01/16/2013  Medication Sig Dispense Refill  . alprazolam (XANAX) 2 MG tablet Take 2 mg by mouth 3 (three) times daily as needed for anxiety.      Marland Kitchen HYDROcodone-acetaminophen (NORCO) 10-325 MG per tablet Take 1 tablet by mouth every 6 (six) hours as needed for pain.      . mometasone-formoterol (DULERA) 200-5 MCG/ACT AERO Inhale 2 puffs into the lungs 2 (two) times daily.  8.8 g  0  . morphine (MS CONTIN) 30 MG 12 hr tablet Take 30 mg by mouth 2 (two) times daily.      . Multiple Vitamin (MULTIVITAMIN) tablet Take 1 tablet by mouth daily.      . predniSONE (STERAPRED UNI-PAK) 10 MG tablet Take 5tabs/day for 5days, then 4tabs per day till follow with Dr Delford Field on 9/16. Take with food.  38 tablet  0  . sertraline (ZOLOFT) 100 MG tablet Take 150 mg by mouth daily.      . vitamin C (ASCORBIC ACID) 500 MG tablet Take 500 mg by mouth daily.      . [DISCONTINUED] furosemide (LASIX) 20  MG tablet Take 1 tablet (20 mg total) by mouth daily.  30 tablet  o  . [DISCONTINUED] potassium chloride (K-DUR) 10  MEQ tablet Take 1 tablet (10 mEq total) by mouth daily.  30 tablet  0  . [DISCONTINUED] azithromycin (ZITHROMAX) 500 MG tablet Take 1 tablet (500 mg total) by mouth daily.  1 tablet  0  . [DISCONTINUED] cefUROXime (CEFTIN) 500 MG tablet Take 1 tablet (500 mg total) by mouth 2 (two) times daily.  12 tablet  0   No facility-administered encounter medications on file as of 01/16/2013.

## 2013-01-17 ENCOUNTER — Telehealth: Payer: Self-pay | Admitting: *Deleted

## 2013-01-17 ENCOUNTER — Encounter: Payer: Self-pay | Admitting: Critical Care Medicine

## 2013-01-17 MED ORDER — POTASSIUM CHLORIDE ER 10 MEQ PO TBCR: EXTENDED_RELEASE_TABLET | ORAL | Status: AC

## 2013-01-17 NOTE — Assessment & Plan Note (Signed)
  Recent Labs Lab 01/16/13 1025  NA 141  K 3.1*  CL 101  CO2 32  GLUCOSE 95  BUN 15  CREATININE 0.8  CALCIUM 8.6   K still low Will instruct pt to stop lasix and cont potassium supp for one more week then stop

## 2013-01-17 NOTE — Telephone Encounter (Signed)
Called, spoke with pt.  Informed her of below per Dr. Delford Field.  She verbalized understanding of results and recs.

## 2013-01-17 NOTE — Telephone Encounter (Signed)
lmomtcb for pt 

## 2013-01-17 NOTE — Assessment & Plan Note (Signed)
RB ILD on CT with bronchiectasis. Alpha one AT normal Ig G M A normal IgE mild elevation Improved Smoking cessation key Plan Smoking cessation>>80min intervention given, no NRT needed Cont inhaled meds No long needs oxygen >>D/C

## 2013-01-17 NOTE — Telephone Encounter (Signed)
Message copied by Valentino Hue on Wed Jan 17, 2013  9:20 AM ------      Message from: Shan Levans E      Created: Wed Jan 17, 2013  7:01 AM       Call this pt and tell her to STAY on potassium for one more week      Ok to stop lasix      Her K level low ------

## 2013-01-17 NOTE — Telephone Encounter (Signed)
OV note and labs from yesterday's OV have been faxed to [facility] attn: Dr. Andrey Campanile.   Sonya aware.

## 2013-01-17 NOTE — Progress Notes (Signed)
Quick Note:  Spoke with pt. Informed her of lab results and recs per Dr. Delford Field. She verbalized understanding of both. ______

## 2013-01-17 NOTE — Progress Notes (Signed)
Quick Note:  lmomtcb for pt ______ 

## 2013-01-17 NOTE — Telephone Encounter (Signed)
Pt returned call and can be reached @ [phone] or [phone]. Doris Obrien

## 2013-02-05 ENCOUNTER — Other Ambulatory Visit (HOSPITAL_COMMUNITY): Payer: Self-pay | Admitting: Family Medicine

## 2013-02-05 DIAGNOSIS — Z139 Encounter for screening, unspecified: Secondary | ICD-10-CM

## 2013-02-09 LAB — CULTURE, BLOOD (ROUTINE X 2)

## 2013-02-15 ENCOUNTER — Other Ambulatory Visit: Payer: Self-pay | Admitting: Neurosurgery

## 2013-02-15 DIAGNOSIS — M4310 Spondylolisthesis, site unspecified: Secondary | ICD-10-CM

## 2013-02-21 ENCOUNTER — Ambulatory Visit
Admission: RE | Admit: 2013-02-21 | Discharge: 2013-02-21 | Disposition: A | Discharge status: Home / Self Care | Payer: Medicaid Other | Source: Ambulatory Visit | Attending: Neurosurgery | Admitting: Neurosurgery

## 2013-02-21 DIAGNOSIS — M4310 Spondylolisthesis, site unspecified: Secondary | ICD-10-CM

## 2013-02-24 ENCOUNTER — Other Ambulatory Visit: Payer: Self-pay | Admitting: Internal Medicine

## 2013-03-16 ENCOUNTER — Ambulatory Visit (HOSPITAL_COMMUNITY)
Admission: RE | Admit: 2013-03-16 | Discharge: 2013-03-16 | Disposition: A | Discharge status: Home / Self Care | Payer: Medicaid Other | Source: Ambulatory Visit | Attending: Family Medicine | Admitting: Family Medicine

## 2013-03-16 DIAGNOSIS — Z1231 Encounter for screening mammogram for malignant neoplasm of breast: Secondary | ICD-10-CM | POA: Insufficient documentation

## 2013-03-16 DIAGNOSIS — Z139 Encounter for screening, unspecified: Secondary | ICD-10-CM

## 2013-04-12 ENCOUNTER — Telehealth: Payer: Self-pay | Admitting: Critical Care Medicine

## 2013-04-12 NOTE — Telephone Encounter (Signed)
ATC x 2 line busy  

## 2013-04-13 NOTE — Telephone Encounter (Signed)
LMTC x 1  

## 2013-04-16 NOTE — Telephone Encounter (Signed)
LMTCBx2. Karlee Staff, CMA  

## 2013-04-17 NOTE — Telephone Encounter (Signed)
LMTCbx3. Jennifer Castillo, CMA  

## 2013-04-18 NOTE — Telephone Encounter (Signed)
Per protocol I will sign off on message and await call back. Mattison Golay, CMA  

## 2013-05-25 ENCOUNTER — Ambulatory Visit: Payer: Medicaid Other | Admitting: Critical Care Medicine

## 2014-04-29 ENCOUNTER — Other Ambulatory Visit: Payer: Self-pay | Admitting: Family Medicine

## 2014-04-29 DIAGNOSIS — R0989 Other specified symptoms and signs involving the circulatory and respiratory systems: Secondary | ICD-10-CM

## 2014-04-30 ENCOUNTER — Ambulatory Visit
Admission: RE | Admit: 2014-04-30 | Discharge: 2014-04-30 | Disposition: A | Discharge status: Home / Self Care | Payer: BC Managed Care – PPO | Source: Ambulatory Visit | Attending: Family Medicine | Admitting: Family Medicine

## 2014-04-30 DIAGNOSIS — R0989 Other specified symptoms and signs involving the circulatory and respiratory systems: Secondary | ICD-10-CM

## 2015-08-26 DIAGNOSIS — F411 Generalized anxiety disorder: Secondary | ICD-10-CM | POA: Diagnosis present

## 2015-08-26 DIAGNOSIS — M545 Low back pain, unspecified: Secondary | ICD-10-CM | POA: Diagnosis present

## 2015-08-26 DIAGNOSIS — G8929 Other chronic pain: Secondary | ICD-10-CM | POA: Diagnosis present

## 2015-08-26 DIAGNOSIS — F4310 Post-traumatic stress disorder, unspecified: Secondary | ICD-10-CM | POA: Insufficient documentation

## 2015-08-26 DIAGNOSIS — M5416 Radiculopathy, lumbar region: Secondary | ICD-10-CM | POA: Insufficient documentation

## 2015-12-01 ENCOUNTER — Other Ambulatory Visit (HOSPITAL_COMMUNITY): Payer: Self-pay | Admitting: Family Medicine

## 2015-12-01 DIAGNOSIS — Z1231 Encounter for screening mammogram for malignant neoplasm of breast: Secondary | ICD-10-CM

## 2015-12-04 ENCOUNTER — Ambulatory Visit (HOSPITAL_COMMUNITY)
Admission: RE | Admit: 2015-12-04 | Discharge: 2015-12-04 | Disposition: A | Discharge status: Home / Self Care | Payer: BLUE CROSS/BLUE SHIELD | Source: Ambulatory Visit | Attending: Family Medicine | Admitting: Family Medicine

## 2015-12-04 ENCOUNTER — Other Ambulatory Visit (HOSPITAL_COMMUNITY): Payer: Self-pay | Admitting: Family Medicine

## 2015-12-04 ENCOUNTER — Ambulatory Visit (HOSPITAL_COMMUNITY): Payer: Self-pay

## 2015-12-04 DIAGNOSIS — Z1231 Encounter for screening mammogram for malignant neoplasm of breast: Secondary | ICD-10-CM | POA: Diagnosis present

## 2016-06-24 DIAGNOSIS — M797 Fibromyalgia: Secondary | ICD-10-CM | POA: Insufficient documentation

## 2017-07-29 ENCOUNTER — Telehealth: Payer: Self-pay | Admitting: Gastroenterology

## 2017-08-01 NOTE — Telephone Encounter (Signed)
Called [facility] to get path report faxed to office for review. Records will be in referral folder until path received.

## 2017-08-01 NOTE — Telephone Encounter (Signed)
(  For documentation - indication: screening and history of colon cancer in sister.  colonoscopy with Suprep to TI 07/12/12; prep quality not recorded.  Multiple subcm polyps removed, one lost, perhaps others in rectosigmoid- ? some left, report unclear .)  I reviewed the colonoscopy report.  There is no pathology report.  Please obtain the pathology report so I know what type of polyps they were, and therefore whether the indication is History of colon polyps or Family history of colon cancer.  Of note, she will need a 2-day prep due to chronic opioid therapy.

## 2017-09-14 NOTE — Telephone Encounter (Signed)
Have not received path report.

## 2017-10-18 ENCOUNTER — Other Ambulatory Visit (HOSPITAL_COMMUNITY): Payer: Self-pay | Admitting: Family Medicine

## 2017-10-18 DIAGNOSIS — Z1231 Encounter for screening mammogram for malignant neoplasm of breast: Secondary | ICD-10-CM

## 2017-10-21 ENCOUNTER — Ambulatory Visit (HOSPITAL_COMMUNITY)
Admission: RE | Admit: 2017-10-21 | Discharge: 2017-10-21 | Disposition: A | Discharge status: Home / Self Care | Payer: 59 | Source: Ambulatory Visit | Attending: Family Medicine | Admitting: Family Medicine

## 2017-10-21 ENCOUNTER — Encounter (HOSPITAL_COMMUNITY): Payer: Self-pay

## 2017-10-21 DIAGNOSIS — Z1231 Encounter for screening mammogram for malignant neoplasm of breast: Secondary | ICD-10-CM | POA: Diagnosis present

## 2017-11-23 NOTE — Telephone Encounter (Signed)
Received pathology report will be placed on Dr.Danis' desk for review again.

## 2017-11-24 NOTE — Telephone Encounter (Addendum)
Polyp report documents 47mm sessile serrated adenoma, also rectosigmoid hyperplastic polyp  Please arrange Doris Obrien nurse visit for colonoscopy on my schedule.  Diagnosis: history of colon polyps 2 day prep needed due to chronic opioid therapy

## 2017-11-28 ENCOUNTER — Encounter: Payer: Self-pay | Admitting: Gastroenterology

## 2017-11-28 NOTE — Telephone Encounter (Signed)
Patient has been scheduled for colonoscopy in [place].

## 2018-01-11 ENCOUNTER — Ambulatory Visit (AMBULATORY_SURGERY_CENTER): Payer: Self-pay | Admitting: *Deleted

## 2018-01-11 VITALS — Ht 64.0 in | Wt 146.0 lb

## 2018-01-11 DIAGNOSIS — Z800 Family history of malignant neoplasm of digestive organs: Secondary | ICD-10-CM

## 2018-01-11 DIAGNOSIS — Z86010 Personal history of colonic polyps: Secondary | ICD-10-CM

## 2018-01-11 MED ORDER — NA SULFATE-K SULFATE-MG SULF 17.5-3.13-1.6 GM/177ML PO SOLN: ORAL | 0 refills | Status: DC

## 2018-01-11 NOTE — Progress Notes (Signed)
Patient denies any allergies to eggs or soy. Patient denies any problems with anesthesia/sedation. Patient denies any oxygen use at home. Patient denies taking any diet/weight loss medications or blood thinners. EMMI education offered, pt declined. Pt's sister had post-op bleeding from a large polyp site after removal, pt is concerned and wants the MD to know this. I encouraged pt to tell MD the day of procedure.

## 2018-01-25 ENCOUNTER — Encounter: Payer: Self-pay | Admitting: Gastroenterology

## 2018-01-25 ENCOUNTER — Ambulatory Visit (AMBULATORY_SURGERY_CENTER): Payer: 59 | Admitting: Gastroenterology

## 2018-01-25 VITALS — BP 131/77 | HR 69 | Temp 98.2°F | Resp 14 | Ht 64.0 in | Wt 146.0 lb

## 2018-01-25 DIAGNOSIS — D124 Benign neoplasm of descending colon: Secondary | ICD-10-CM | POA: Diagnosis not present

## 2018-01-25 DIAGNOSIS — D122 Benign neoplasm of ascending colon: Secondary | ICD-10-CM

## 2018-01-25 DIAGNOSIS — D123 Benign neoplasm of transverse colon: Secondary | ICD-10-CM

## 2018-01-25 DIAGNOSIS — Z86010 Personal history of colonic polyps: Secondary | ICD-10-CM | POA: Diagnosis not present

## 2018-01-25 MED ORDER — SODIUM CHLORIDE 0.9 % IV SOLN: 500.0000 mL | Freq: Once | INTRAVENOUS | Status: AC

## 2018-01-25 NOTE — Progress Notes (Signed)
Called to room to assist during endoscopic procedure.  Patient ID and intended procedure confirmed with present staff. Received instructions for my participation in the procedure from the performing physician.  

## 2018-01-25 NOTE — Patient Instructions (Signed)
YOU HAD AN ENDOSCOPIC PROCEDURE TODAY AT THE [facility]:   Refer to the procedure report that was given to you for any specific questions about what was found during the examination.  If the procedure report does not answer your questions, please call your gastroenterologist to clarify.  If you requested that your care partner not be given the details of your procedure findings, then the procedure report has been included in a sealed envelope for you to review at your convenience later.  YOU SHOULD EXPECT: Some feelings of bloating in the abdomen. Passage of more gas than usual.  Walking can help get rid of the air that was put into your GI tract during the procedure and reduce the bloating. If you had a lower endoscopy (such as a colonoscopy or flexible sigmoidoscopy) you may notice spotting of blood in your stool or on the toilet paper. If you underwent a bowel prep for your procedure, you may not have a normal bowel movement for a few days.  Please Note:  You might notice some irritation and congestion in your nose or some drainage.  This is from the oxygen used during your procedure.  There is no need for concern and it should clear up in a day or so.  SYMPTOMS TO REPORT IMMEDIATELY:   Following lower endoscopy (colonoscopy or flexible sigmoidoscopy):  Excessive amounts of blood in the stool  Significant tenderness or worsening of abdominal pains  Swelling of the abdomen that is new, acute  Fever of 100F or higher  For urgent or emergent issues, a gastroenterologist can be reached at any hour by calling [phone].   DIET:  We do recommend a small meal at first, but then you may proceed to your regular diet.  Drink plenty of fluids but you should avoid alcoholic beverages for 24 hours.  MEDICATIONS: Continue present medications.  Please see handouts given to you by your recovery nurse.  ACTIVITY:  You should plan to take it easy for the rest of today and you should NOT  DRIVE or use heavy machinery until tomorrow (because of the sedation medicines used during the test).    FOLLOW UP: Our staff will call the number listed on your records the next business day following your procedure to check on you and address any questions or concerns that you may have regarding the information given to you following your procedure. If we do not reach you, we will leave a message.  However, if you are feeling well and you are not experiencing any problems, there is no need to return our call.  We will assume that you have returned to your regular daily activities without incident.  If any biopsies were taken you will be contacted by phone or by letter within the next 1-3 weeks.  Please call us at [phone] if you have not heard about the biopsies in 3 weeks.   Thank you for allowing us to provide for your healthcare needs today.   SIGNATURES/CONFIDENTIALITY: You and/or your care partner have signed paperwork which will be entered into your electronic medical record.  These signatures attest to the fact that that the information above on your After Visit Summary has been reviewed and is understood.  Full responsibility of the confidentiality of this discharge information lies with you and/or your care-partner. 

## 2018-01-25 NOTE — Progress Notes (Signed)
A and O x3. Report to RN. Tolerated MAC anesthesia well.

## 2018-01-25 NOTE — Progress Notes (Signed)
Pt's states no medical or surgical changes since previsit or office visit. 

## 2018-01-25 NOTE — Op Note (Signed)
[facility] Patient Name: Doris Obrien Procedure Date: 01/25/2018 11:12 AM MRN: [id number] Endoscopist: Mallie Mussel L. Loletha Carrow , MD Age: 57 Referring MD:  Date of Birth: 12-14-1960 Gender: Female Account #: [id number] Procedure:                Colonoscopy Indications:              Increased risk colon cancer surveillance: Personal                            history of sessile serrated colon polyp (less than                            10 mm in size) with no dysplasia 07/2012 (at outside                            institution) Medicines:                Monitored Anesthesia Care Procedure:                Pre-Anesthesia Assessment:                           - Prior to the procedure, a History and Physical                            was performed, and patient medications and                            allergies were reviewed. The patient's tolerance of                            previous anesthesia was also reviewed. The risks                            and benefits of the procedure and the sedation                            options and risks were discussed with the patient.                            All questions were answered, and informed consent                            was obtained. Prior Anticoagulants: The patient has                            taken no previous anticoagulant or antiplatelet                            agents. ASA Grade Assessment: II - A patient with                            mild systemic disease. After reviewing the risks  and benefits, the patient was deemed in                            satisfactory condition to undergo the procedure.                           After obtaining informed consent, the colonoscope                            was passed under direct vision. Throughout the                            procedure, the patient's blood pressure, pulse, and                            oxygen saturations were monitored  continuously. The                            Colonoscope was introduced through the anus and                            advanced to the the terminal ileum, with                            identification of the appendiceal orifice and IC                            valve. The colonoscopy was performed without                            difficulty. The patient tolerated the procedure                            well. The quality of the bowel preparation was                            good. The terminal ileum, ileocecal valve,                            appendiceal orifice, and rectum were photographed.                            The quality of the bowel preparation was evaluated                            using the BBPS [facility] Bowel Preparation Scale)                            with scores of: Right Colon = 2, Transverse Colon =                            2 and Left Colon = 2. The total BBPS score equals  6. The bowel preparation used was 2 day                            Suprep/Miralax ( due to chronic opioid therapy). Scope In: 11:23:24 AM Scope Out: 11:43:39 AM Scope Withdrawal Time: 0 hours 16 minutes 13 seconds  Total Procedure Duration: 0 hours 20 minutes 15 seconds  Findings:                 The perianal and digital rectal examinations were                            normal.                           An 8 mm polyp with a mucus cap was found in the mid                            ascending colon. The polyp was sessile. The polyp                            was removed with a cold snare. Resection and                            retrieval were complete.                           A 3 mm polyp was found in the descending colon. The                            polyp was sessile. The polyp was removed with a                            cold snare. Resection and retrieval were complete.                           Diverticula were found in the left colon.                            The exam was otherwise without abnormality on                            direct and retroflexion views. Complications:            No immediate complications. Estimated Blood Loss:     Estimated blood loss was minimal. Impression:               - One 8 mm polyp in the mid ascending colon,                            removed with a cold snare. Resected and retrieved.                           - One 3 mm polyp in the descending colon, removed  with a cold snare. Resected and retrieved.                           - Diverticulosis in the left colon.                           - The examination was otherwise normal on direct                            and retroflexion views. Recommendation:           - Patient has a contact number available for                            emergencies. The signs and symptoms of potential                            delayed complications were discussed with the                            patient. Return to normal activities tomorrow.                            Written discharge instructions were provided to the                            patient.                           - Resume previous diet.                           - Continue present medications.                           - Await pathology results.                           - Repeat colonoscopy is recommended for                            surveillance. The colonoscopy date will be                            determined after pathology results from today's                            exam become available for review. No longer than 5                            years due to family history of colon cancer in                            sister. Jamie Hafford L. Loletha Carrow, MD 01/25/2018 11:54:01 AM This report has been signed electronically.

## 2018-01-26 ENCOUNTER — Telehealth: Payer: Self-pay

## 2018-01-26 ENCOUNTER — Telehealth: Payer: Self-pay | Admitting: *Deleted

## 2018-01-26 NOTE — Telephone Encounter (Signed)
No answer for post procedure call back. Left message and will attempt to call back later this afternoon. Sm 

## 2018-01-26 NOTE — Telephone Encounter (Signed)
  Follow up Call-  Call back number 01/25/2018  Post procedure Call Back phone  # [phone]  Permission to leave phone message Yes  Some recent data might be hidden     Patient questions:  Do you have a fever, pain , or abdominal swelling? No. Pain Score  0 *  Have you tolerated food without any problems? Yes.    Have you been able to return to your normal activities? Yes.    Do you have any questions about your discharge instructions: Diet   No. Medications  No. Follow up visit  No.  Do you have questions or concerns about your Care? No.  Actions: * If pain score is 4 or above: No action needed, pain <4.

## 2018-01-31 ENCOUNTER — Encounter: Payer: Self-pay | Admitting: Gastroenterology

## 2018-09-30 DIAGNOSIS — L281 Prurigo nodularis: Secondary | ICD-10-CM | POA: Insufficient documentation

## 2018-12-11 ENCOUNTER — Other Ambulatory Visit (HOSPITAL_COMMUNITY): Payer: Self-pay | Admitting: Family Medicine

## 2018-12-11 DIAGNOSIS — Z1231 Encounter for screening mammogram for malignant neoplasm of breast: Secondary | ICD-10-CM

## 2019-01-17 ENCOUNTER — Ambulatory Visit (HOSPITAL_COMMUNITY)
Admission: RE | Admit: 2019-01-17 | Discharge: 2019-01-17 | Disposition: A | Discharge status: Home / Self Care | Payer: Medicare HMO | Source: Ambulatory Visit | Attending: Family Medicine | Admitting: Family Medicine

## 2019-01-17 ENCOUNTER — Other Ambulatory Visit: Payer: Self-pay

## 2019-01-17 DIAGNOSIS — Z1231 Encounter for screening mammogram for malignant neoplasm of breast: Secondary | ICD-10-CM | POA: Insufficient documentation

## 2020-01-10 ENCOUNTER — Other Ambulatory Visit (HOSPITAL_COMMUNITY): Payer: Self-pay | Admitting: Family Medicine

## 2020-01-10 ENCOUNTER — Other Ambulatory Visit: Payer: Self-pay | Admitting: Family Medicine

## 2020-01-10 DIAGNOSIS — F17200 Nicotine dependence, unspecified, uncomplicated: Secondary | ICD-10-CM

## 2020-02-01 ENCOUNTER — Other Ambulatory Visit: Payer: Self-pay

## 2020-02-01 ENCOUNTER — Ambulatory Visit (HOSPITAL_COMMUNITY)
Admission: RE | Admit: 2020-02-01 | Discharge: 2020-02-01 | Disposition: A | Discharge status: Home / Self Care | Payer: Medicare HMO | Source: Ambulatory Visit | Attending: Family Medicine | Admitting: Family Medicine

## 2020-02-01 DIAGNOSIS — Z122 Encounter for screening for malignant neoplasm of respiratory organs: Secondary | ICD-10-CM | POA: Insufficient documentation

## 2020-02-01 DIAGNOSIS — F17210 Nicotine dependence, cigarettes, uncomplicated: Secondary | ICD-10-CM | POA: Diagnosis not present

## 2020-02-01 DIAGNOSIS — F17200 Nicotine dependence, unspecified, uncomplicated: Secondary | ICD-10-CM

## 2020-02-01 DIAGNOSIS — J439 Emphysema, unspecified: Secondary | ICD-10-CM | POA: Insufficient documentation

## 2020-02-01 DIAGNOSIS — I700 Atherosclerosis of aorta: Secondary | ICD-10-CM | POA: Diagnosis not present

## 2021-01-30 DIAGNOSIS — F1120 Opioid dependence, uncomplicated: Secondary | ICD-10-CM | POA: Insufficient documentation

## 2021-02-10 ENCOUNTER — Encounter: Payer: Self-pay | Admitting: Gastroenterology

## 2022-04-13 IMAGING — CT CT CHEST LUNG CANCER SCREENING LOW DOSE W/O CM
2 of 3 series · 15 of 36 positions shown, 18 images · non-contrast
Comparison: 01/05/2013 chest CT.

CLINICAL DATA: 59-year-old asymptomatic female current smoker with
48 pack-year smoking history.

EXAM:
CT CHEST WITHOUT CONTRAST LOW-DOSE FOR LUNG CANCER SCREENING
TECHNIQUE: Multidetector CT imaging of the chest was performed following the
standard protocol without IV contrast.

[Series 2: axial st · axial · 0.59mm/px · z∈[+1161,+1451]mm · 12 of 68 slices shown, 15 images]
[im 5/68  mediastinal]
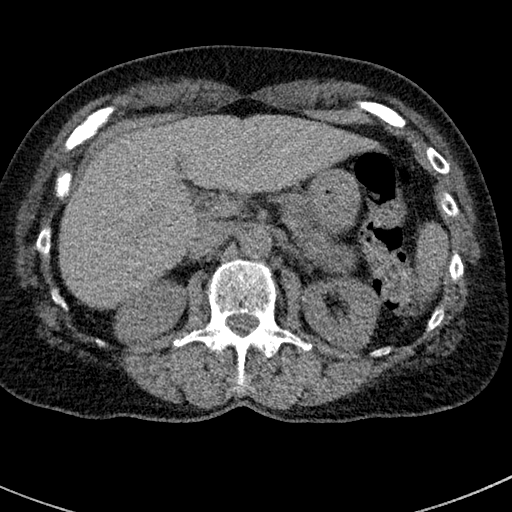
[im 5/68  lung]
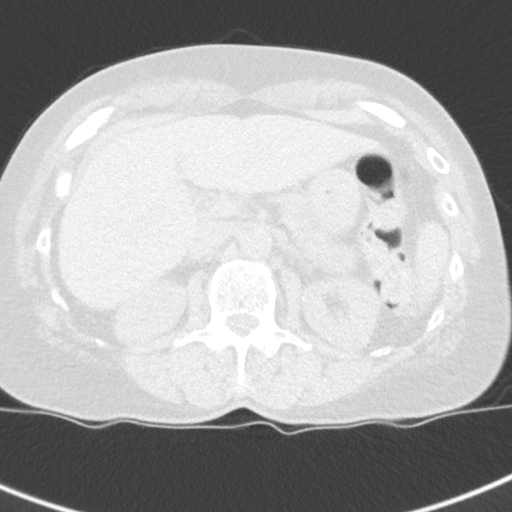
[im 10/68  lung]
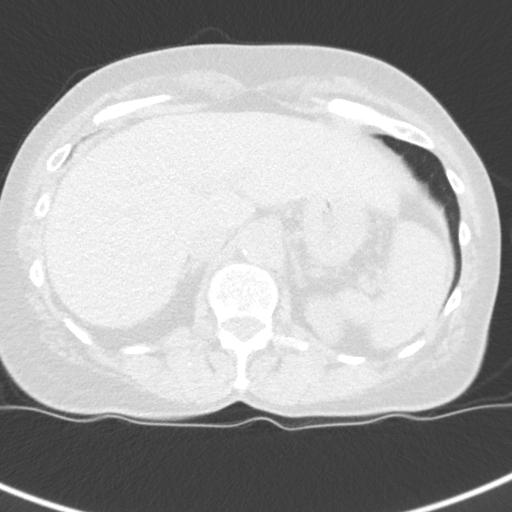
[im 15/68  lung]
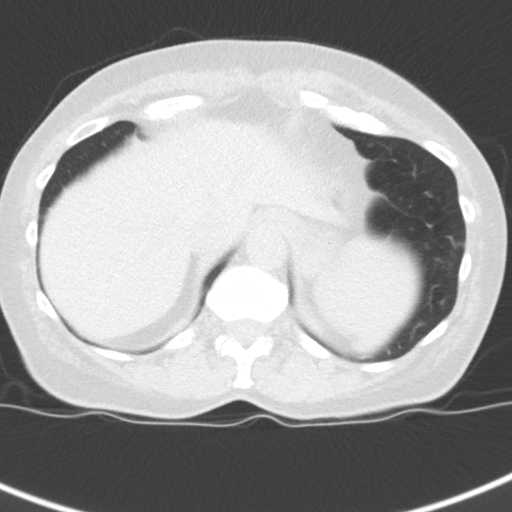
[im 20/68  lung]
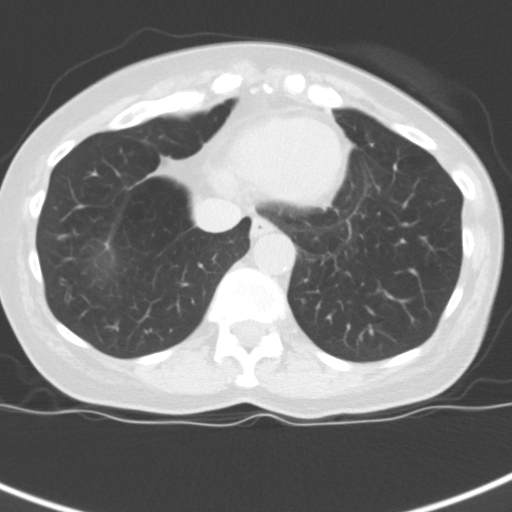
[im 25/68  mediastinal]
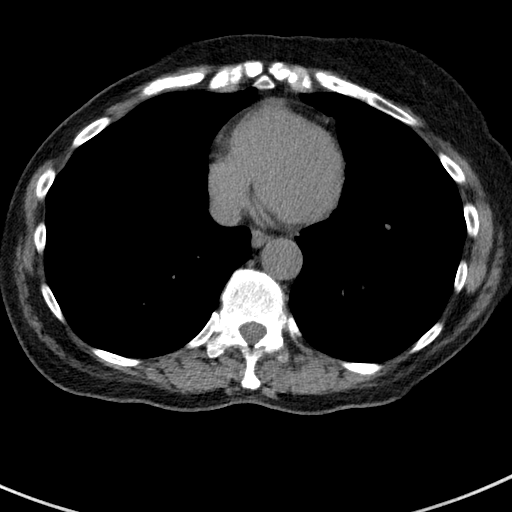
[im 25/68  lung]
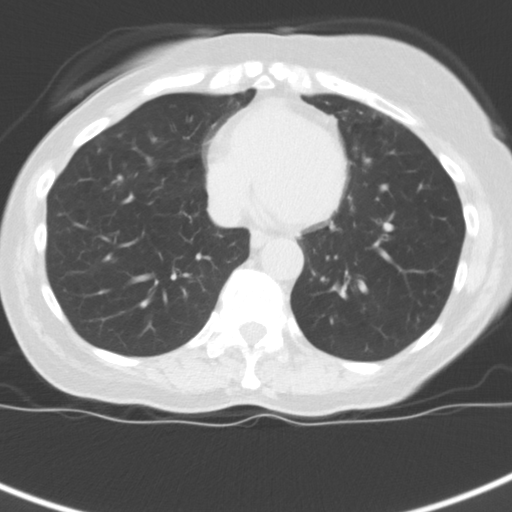
[im 30/68  lung]
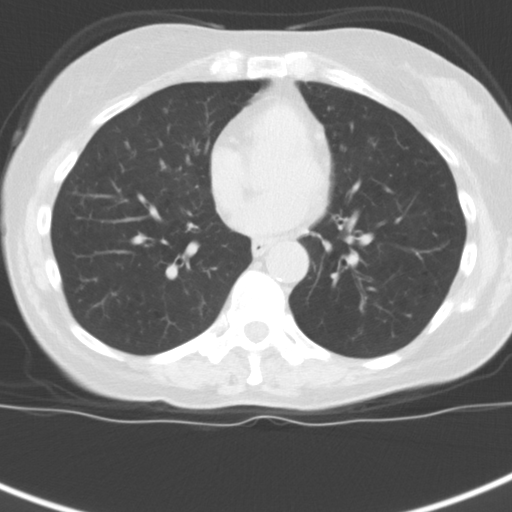
[im 38/68  lung]
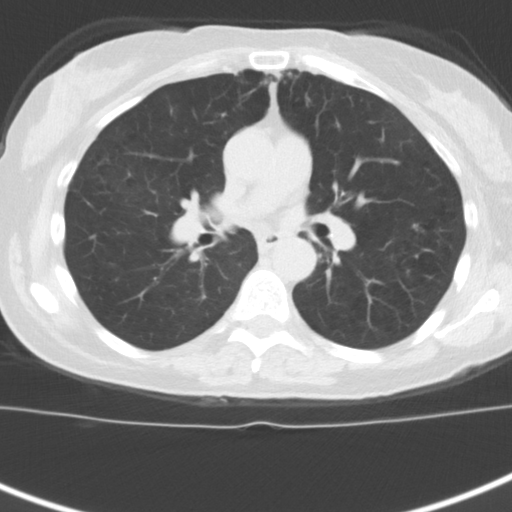
[im 43/68  lung]
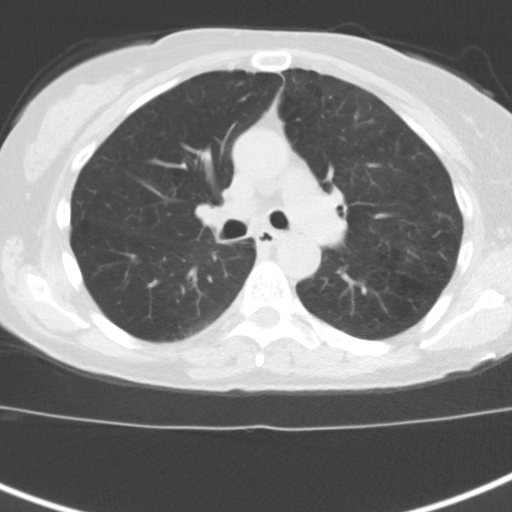
[im 48/68  mediastinal]
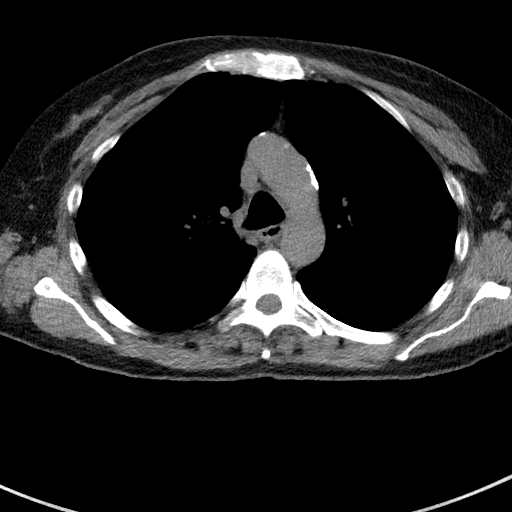
[im 48/68  lung]
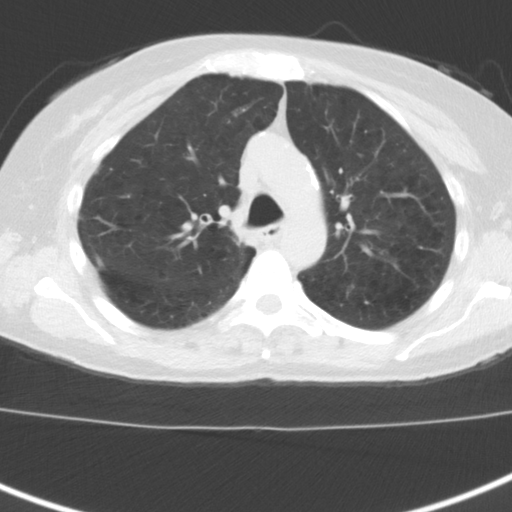
[im 53/68  lung]
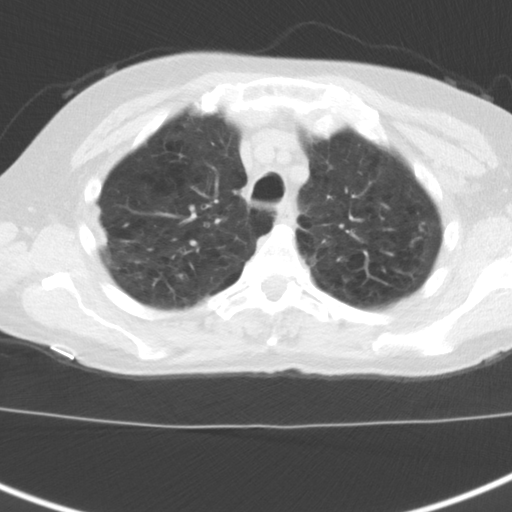
[im 58/68  lung]
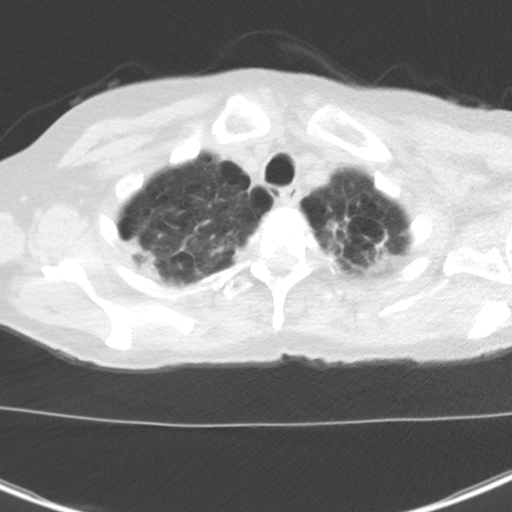
[im 63/68  lung]
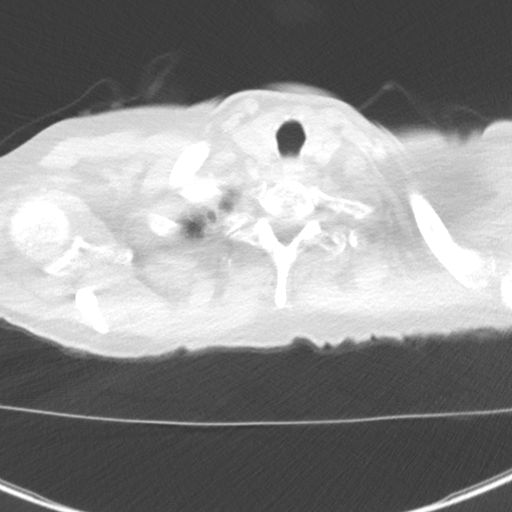

[Series 5: coronal · coronal · 0.59mm/px · 3 of 245 slices shown]
[im 49/245  lung]
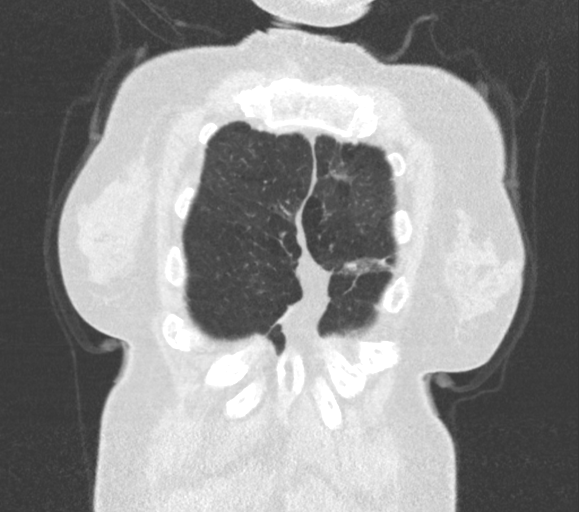
[im 98/245  lung]
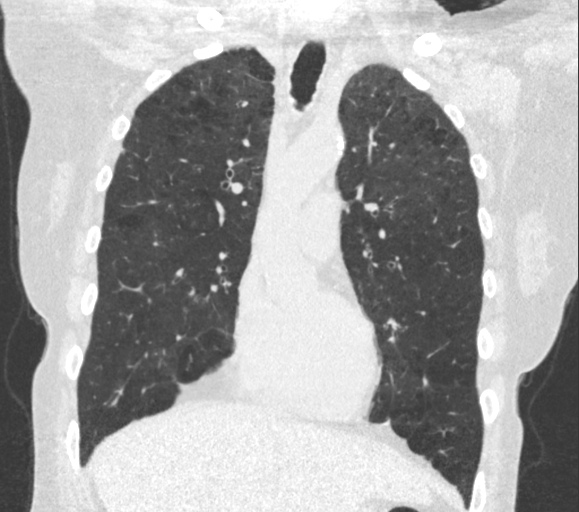
[im 147/245  lung]
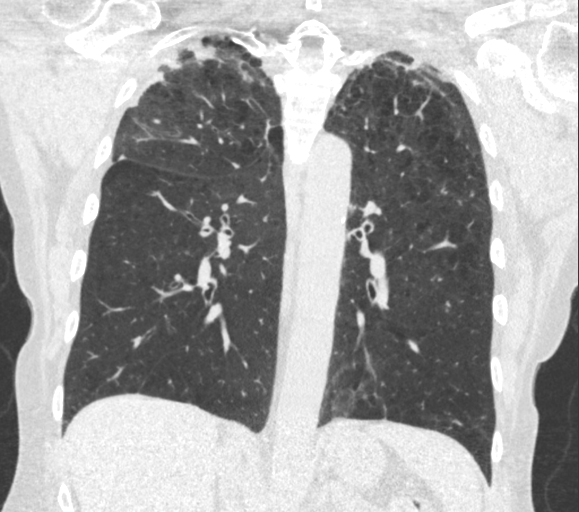

[15 of 36 positions shown; findings below may reference images not displayed]

FINDINGS: Cardiovascular: Normal heart size. No significant pericardial
effusion/thickening. Atherosclerotic nonaneurysmal thoracic aorta.
Normal caliber pulmonary arteries.

Mediastinum/Nodes: No discrete thyroid nodules. Unremarkable
esophagus. No pathologically enlarged axillary, mediastinal or hilar
lymph nodes, noting limited sensitivity for the detection of hilar
adenopathy on this noncontrast study.

Lungs/Pleura: No pneumothorax. No pleural effusion. Biapical
pleural-parenchymal curvilinear and reticulonodular scarring is
chronic and not substantially changed since 5036 CT. Moderate
centrilobular and paraseptal emphysema with diffuse bronchial wall
thickening. No acute consolidative airspace disease or lung masses.
A few scattered small solid pulmonary nodules, largest 4.9 mm in
volume derived mean diameter in the peripheral right upper lobe
(series 4/image 95).

Upper abdomen: Low-attenuation 1.2 cm left liver lesion (series
2/image 62) is decreased from 1.6 cm on 01/05/2013 CT, where it was
seen to represent a simple cyst.

Musculoskeletal: No aggressive appearing focal osseous lesions. Mild
thoracic spondylosis.
IMPRESSION: 1. Lung-RADS 2, benign appearance or behavior. Continue annual
screening with low-dose chest CT without contrast in 12 months.
2. Aortic Atherosclerosis (9EM88-RPV.V) and Emphysema (9EM88-88R.O).

## 2022-06-21 ENCOUNTER — Emergency Department (HOSPITAL_COMMUNITY): Payer: 59

## 2022-06-21 ENCOUNTER — Encounter (HOSPITAL_COMMUNITY): Payer: Self-pay

## 2022-06-21 ENCOUNTER — Inpatient Hospital Stay (HOSPITAL_COMMUNITY)
Admission: EM | Admit: 2022-06-21 | Discharge: 2022-06-23 | DRG: 178 | Disposition: A | Discharge status: Home / Self Care | Payer: 59 | Attending: Internal Medicine | Admitting: Internal Medicine

## 2022-06-21 ENCOUNTER — Other Ambulatory Visit: Payer: Self-pay

## 2022-06-21 DIAGNOSIS — F4310 Post-traumatic stress disorder, unspecified: Secondary | ICD-10-CM | POA: Diagnosis present

## 2022-06-21 DIAGNOSIS — Z800 Family history of malignant neoplasm of digestive organs: Secondary | ICD-10-CM

## 2022-06-21 DIAGNOSIS — M797 Fibromyalgia: Secondary | ICD-10-CM | POA: Diagnosis present

## 2022-06-21 DIAGNOSIS — G8929 Other chronic pain: Secondary | ICD-10-CM | POA: Diagnosis present

## 2022-06-21 DIAGNOSIS — Z7951 Long term (current) use of inhaled steroids: Secondary | ICD-10-CM

## 2022-06-21 DIAGNOSIS — U071 COVID-19: Secondary | ICD-10-CM | POA: Diagnosis present

## 2022-06-21 DIAGNOSIS — J479 Bronchiectasis, uncomplicated: Secondary | ICD-10-CM | POA: Diagnosis present

## 2022-06-21 DIAGNOSIS — Z79899 Other long term (current) drug therapy: Secondary | ICD-10-CM

## 2022-06-21 DIAGNOSIS — R0902 Hypoxemia: Secondary | ICD-10-CM | POA: Diagnosis present

## 2022-06-21 DIAGNOSIS — J439 Emphysema, unspecified: Secondary | ICD-10-CM | POA: Diagnosis present

## 2022-06-21 DIAGNOSIS — E876 Hypokalemia: Secondary | ICD-10-CM | POA: Diagnosis present

## 2022-06-21 DIAGNOSIS — Z833 Family history of diabetes mellitus: Secondary | ICD-10-CM | POA: Diagnosis not present

## 2022-06-21 DIAGNOSIS — J849 Interstitial pulmonary disease, unspecified: Secondary | ICD-10-CM | POA: Diagnosis present

## 2022-06-21 DIAGNOSIS — K5730 Diverticulosis of large intestine without perforation or abscess without bleeding: Secondary | ICD-10-CM | POA: Insufficient documentation

## 2022-06-21 DIAGNOSIS — F411 Generalized anxiety disorder: Secondary | ICD-10-CM | POA: Diagnosis present

## 2022-06-21 DIAGNOSIS — G43909 Migraine, unspecified, not intractable, without status migrainosus: Secondary | ICD-10-CM

## 2022-06-21 DIAGNOSIS — F17210 Nicotine dependence, cigarettes, uncomplicated: Secondary | ICD-10-CM | POA: Diagnosis present

## 2022-06-21 DIAGNOSIS — F17200 Nicotine dependence, unspecified, uncomplicated: Secondary | ICD-10-CM | POA: Diagnosis present

## 2022-06-21 DIAGNOSIS — J471 Bronchiectasis with (acute) exacerbation: Secondary | ICD-10-CM | POA: Diagnosis not present

## 2022-06-21 DIAGNOSIS — Z818 Family history of other mental and behavioral disorders: Secondary | ICD-10-CM

## 2022-06-21 DIAGNOSIS — F1120 Opioid dependence, uncomplicated: Secondary | ICD-10-CM | POA: Diagnosis present

## 2022-06-21 DIAGNOSIS — Z803 Family history of malignant neoplasm of breast: Secondary | ICD-10-CM | POA: Diagnosis not present

## 2022-06-21 DIAGNOSIS — I700 Atherosclerosis of aorta: Secondary | ICD-10-CM | POA: Diagnosis present

## 2022-06-21 DIAGNOSIS — M5440 Lumbago with sciatica, unspecified side: Secondary | ICD-10-CM | POA: Diagnosis not present

## 2022-06-21 DIAGNOSIS — M545 Low back pain, unspecified: Secondary | ICD-10-CM | POA: Diagnosis present

## 2022-06-21 LAB — CBC WITH DIFFERENTIAL/PLATELET
Abs Immature Granulocytes: 0.03 10*3/uL (ref 0.00–0.07)
Basophils Absolute: 0 10*3/uL (ref 0.0–0.1)
Basophils Relative: 0 %
Eosinophils Absolute: 0 10*3/uL (ref 0.0–0.5)
Eosinophils Relative: 0 %
HCT: 47.5 % — ABNORMAL HIGH (ref 36.0–46.0)
Hemoglobin: 15.4 g/dL — ABNORMAL HIGH (ref 12.0–15.0)
Immature Granulocytes: 0 %
Lymphocytes Relative: 12 %
Lymphs Abs: 1 10*3/uL (ref 0.7–4.0)
MCH: 32 pg (ref 26.0–34.0)
MCHC: 32.4 g/dL (ref 30.0–36.0)
MCV: 98.5 fL (ref 80.0–100.0)
Monocytes Absolute: 0.8 10*3/uL (ref 0.1–1.0)
Monocytes Relative: 10 %
Neutro Abs: 6.9 10*3/uL (ref 1.7–7.7)
Neutrophils Relative %: 78 %
Platelets: 205 10*3/uL (ref 150–400)
RBC: 4.82 MIL/uL (ref 3.87–5.11)
RDW: 11.8 % (ref 11.5–15.5)
WBC: 8.8 10*3/uL (ref 4.0–10.5)
nRBC: 0 % (ref 0.0–0.2)

## 2022-06-21 LAB — BLOOD GAS, VENOUS
Acid-Base Excess: 11.2 mmol/L — ABNORMAL HIGH (ref 0.0–2.0)
Bicarbonate: 38 mmol/L — ABNORMAL HIGH (ref 20.0–28.0)
O2 Saturation: 48.5 %
Patient temperature: 37
pCO2, Ven: 56 mmHg (ref 44–60)
pH, Ven: 7.44 — ABNORMAL HIGH (ref 7.25–7.43)
pO2, Ven: <31 mmHg — CL (ref 32–45)

## 2022-06-21 LAB — CREATININE, SERUM
Creatinine, Ser: 0.69 mg/dL (ref 0.44–1.00)
GFR, Estimated: >60 mL/min (ref >60)

## 2022-06-21 LAB — BASIC METABOLIC PANEL
Anion gap: 9 (ref 5–15)
BUN: 10 mg/dL (ref 8–23)
CO2: 31 mmol/L (ref 22–32)
Calcium: 8.8 mg/dL — ABNORMAL LOW (ref 8.9–10.3)
Chloride: 98 mmol/L (ref 98–111)
Creatinine, Ser: 0.6 mg/dL (ref 0.44–1.00)
GFR, Estimated: >60 mL/min (ref >60)
Glucose, Bld: 100 mg/dL — ABNORMAL HIGH (ref 70–99)
Potassium: 4 mmol/L (ref 3.5–5.1)
Sodium: 138 mmol/L (ref 135–145)

## 2022-06-21 LAB — LACTATE DEHYDROGENASE: LDH: 142 U/L (ref 98–192)

## 2022-06-21 LAB — RESP PANEL BY RT-PCR (RSV, FLU A&B, COVID)  RVPGX2
Influenza A by PCR: NEGATIVE
Influenza B by PCR: NEGATIVE
Resp Syncytial Virus by PCR: NEGATIVE
SARS Coronavirus 2 by RT PCR: POSITIVE — AB

## 2022-06-21 LAB — FERRITIN: Ferritin: 206 ng/mL (ref 11–307)

## 2022-06-21 LAB — D-DIMER, QUANTITATIVE: D-Dimer, Quant: 0.37 ug/mL-FEU (ref 0.00–0.50)

## 2022-06-21 LAB — FIBRINOGEN: Fibrinogen: 458 mg/dL (ref 210–475)

## 2022-06-21 MED ORDER — ACETAMINOPHEN 500 MG PO TABS
1000.0000 mg | ORAL_TABLET | Freq: Once | ORAL | Status: AC
  Administered 2022-06-21: 1000 mg via ORAL
  Filled 2022-06-21: qty 2

## 2022-06-21 MED ORDER — LACTATED RINGERS IV BOLUS
1000.0000 mL | Freq: Once | INTRAVENOUS | Status: AC
  Administered 2022-06-21: 1000 mL via INTRAVENOUS

## 2022-06-21 MED ORDER — ALPRAZOLAM 0.5 MG PO TABS
2.0000 mg | ORAL_TABLET | Freq: Three times a day (TID) | ORAL | Status: DC | PRN
  Administered 2022-06-22 (×2): 2 mg via ORAL
  Filled 2022-06-21 (×2): qty 4

## 2022-06-21 MED ORDER — SODIUM CHLORIDE 0.9 % IV SOLN: 100.0000 mg | Freq: Every day | INTRAVENOUS | Status: DC

## 2022-06-21 MED ORDER — ACETAMINOPHEN 325 MG PO TABS: 650.0000 mg | ORAL_TABLET | Freq: Four times a day (QID) | ORAL | Status: DC | PRN

## 2022-06-21 MED ORDER — SODIUM CHLORIDE 0.9 % IV SOLN: 200.0000 mg | Freq: Once | INTRAVENOUS | Status: DC

## 2022-06-21 MED ORDER — ENOXAPARIN SODIUM 40 MG/0.4ML IJ SOSY
40.0000 mg | PREFILLED_SYRINGE | INTRAMUSCULAR | Status: DC
  Administered 2022-06-21 – 2022-06-22 (×2): 40 mg via SUBCUTANEOUS
  Filled 2022-06-21 (×2): qty 0.4

## 2022-06-21 MED ORDER — DEXAMETHASONE 4 MG PO TABS
6.0000 mg | ORAL_TABLET | ORAL | Status: DC
  Administered 2022-06-21 – 2022-06-22 (×2): 6 mg via ORAL
  Filled 2022-06-21 (×2): qty 2

## 2022-06-21 MED ORDER — HYDROCODONE-ACETAMINOPHEN 10-325 MG PO TABS: 1.0000 | ORAL_TABLET | Freq: Four times a day (QID) | ORAL | Status: DC | PRN

## 2022-06-21 MED ORDER — ONDANSETRON HCL 4 MG/2ML IJ SOLN: 4.0000 mg | Freq: Four times a day (QID) | INTRAMUSCULAR | Status: DC | PRN

## 2022-06-21 MED ORDER — ALBUTEROL SULFATE HFA 108 (90 BASE) MCG/ACT IN AERS: 1.0000 | INHALATION_SPRAY | RESPIRATORY_TRACT | Status: DC | PRN

## 2022-06-21 MED ORDER — ACETAMINOPHEN 650 MG RE SUPP: 650.0000 mg | Freq: Four times a day (QID) | RECTAL | Status: DC | PRN

## 2022-06-21 MED ORDER — SODIUM CHLORIDE 0.9% FLUSH
3.0000 mL | Freq: Two times a day (BID) | INTRAVENOUS | Status: DC
  Administered 2022-06-21 – 2022-06-23 (×4): 3 mL via INTRAVENOUS

## 2022-06-21 MED ORDER — MOMETASONE FURO-FORMOTEROL FUM 200-5 MCG/ACT IN AERO
2.0000 | INHALATION_SPRAY | Freq: Two times a day (BID) | RESPIRATORY_TRACT | Status: DC
  Administered 2022-06-22 – 2022-06-23 (×3): 2 via RESPIRATORY_TRACT
  Filled 2022-06-21: qty 8.8

## 2022-06-21 MED ORDER — ONDANSETRON HCL 4 MG PO TABS: 4.0000 mg | ORAL_TABLET | Freq: Four times a day (QID) | ORAL | Status: DC | PRN

## 2022-06-21 MED ORDER — IPRATROPIUM-ALBUTEROL 0.5-2.5 (3) MG/3ML IN SOLN
3.0000 mL | Freq: Once | RESPIRATORY_TRACT | Status: AC
  Administered 2022-06-21: 3 mL via RESPIRATORY_TRACT
  Filled 2022-06-21: qty 3

## 2022-06-21 MED ORDER — IPRATROPIUM-ALBUTEROL 0.5-2.5 (3) MG/3ML IN SOLN: 3.0000 mL | RESPIRATORY_TRACT | Status: DC | PRN

## 2022-06-21 MED ORDER — KETOROLAC TROMETHAMINE 15 MG/ML IJ SOLN
15.0000 mg | Freq: Once | INTRAMUSCULAR | Status: AC
  Administered 2022-06-21: 15 mg via INTRAVENOUS
  Filled 2022-06-21: qty 1

## 2022-06-21 MED ORDER — SERTRALINE HCL 50 MG PO TABS
150.0000 mg | ORAL_TABLET | Freq: Every day | ORAL | Status: DC
  Administered 2022-06-22 – 2022-06-23 (×2): 150 mg via ORAL
  Filled 2022-06-21 (×2): qty 1

## 2022-06-21 MED ORDER — PROCHLORPERAZINE EDISYLATE 10 MG/2ML IJ SOLN
10.0000 mg | Freq: Once | INTRAMUSCULAR | Status: AC
  Administered 2022-06-21: 10 mg via INTRAVENOUS
  Filled 2022-06-21: qty 2

## 2022-06-21 MED ORDER — DIPHENHYDRAMINE HCL 25 MG PO CAPS
25.0000 mg | ORAL_CAPSULE | Freq: Once | ORAL | Status: AC
  Administered 2022-06-21: 25 mg via ORAL
  Filled 2022-06-21: qty 1

## 2022-06-21 NOTE — ED Provider Notes (Signed)
[facility] Provider Note   CSN: [id number] Arrival date & time: 06/21/22  1558     History  Chief Complaint  Patient presents with   Headache    Doris Obrien is a 62 y.o. female with bronchiectasis, tobacco use, hypokalemia, ILD, fibromyalgia, GAD, PTSD, opioid dependent who presents with headache.   Patient said the right side of her head has been hurting for a week on and off. Said she has been taking mucinex, she thought it was a sinus headache. +photophobia as well as intermittent N/V. Worsening x 3 days, no h/o similar headache. No thunderclap headache, denies numbness/tingling, asymmetric weakness, visual changes, hearing changes, trouble swallowing or speaking, confusion. No h/o stroke or PE/DVT. Patient noted to be satting 80% room air oxygen saturation in triage. Patient placed on 3L  and improved to 92%. Denies any f/c, CP, SOB, flu-like symptoms. Doesn't wear oxygen at home. Uses nebulizers at home which help. Denies any increase in cough or wheezing. States she coughs all the time d/t post nasal drip from chronic sinusitis. Coughs up clear phlegm, no hemoptysis.   Headache      Home Medications Prior to Admission medications   Medication Sig Start Date End Date Taking? Authorizing Provider  albuterol (PROVENTIL HFA;VENTOLIN HFA) 108 (90 Base) MCG/ACT inhaler TAKE 2 PUFFS BY MOUTH EVERY 4 HOURS AS NEEDED FOR WHEEZE 10/21/17   [provider]  alprazolam Duanne Moron) 2 MG tablet Take 2 mg by mouth 3 (three) times daily as needed for anxiety.    [provider]  Cholecalciferol (VITAMIN D3) 2000 units capsule Take by mouth.    [provider]  HYDROcodone-acetaminophen (NORCO) 10-325 MG per tablet Take 1 tablet by mouth every 6 (six) hours as needed for pain.    [provider]  ipratropium-albuterol (DUONEB) 0.5-2.5 (3) MG/3ML SOLN Use with nebulizer up to 4 times a day as needed. 03/24/16    [provider]  mometasone-formoterol (DULERA) 200-5 MCG/ACT AERO Inhale 2 puffs into the lungs 2 (two) times daily. 01/08/13   Viyuoh, Adeline C, MD  montelukast (SINGULAIR) 10 MG tablet Take 1 tablet by mouth daily. 12/23/17   [provider]  Multiple Vitamin (MULTIVITAMIN) tablet Take 1 tablet by mouth daily.    [provider]  sertraline (ZOLOFT) 100 MG tablet Take 150 mg by mouth daily.    [provider]  vitamin C (ASCORBIC ACID) 500 MG tablet Take 500 mg by mouth daily.    [provider]      Allergies    Patient has no known allergies.    Review of Systems   Review of Systems  Neurological:  Positive for headaches.   Review of systems Negative for f/c.  A 10 point review of systems was performed and is negative unless otherwise reported in HPI.  Physical Exam Updated Vital Signs BP (!) 134/92   Pulse 89   Temp 98.9 F (37.2 C) (Oral)   Resp 16   Ht 5' 3"$  (1.6 m)   Wt 66 kg   SpO2 96%   BMI 25.77 kg/m  Physical Exam General: Normal appearing female, lying in bed.  HEENT: PERRLA, EOMI, no nystagmus, Sclera anicteric, MMM, trachea midline. Tongue protrudes midline. Cardiology: RRR, no murmurs/rubs/gallops. BL radial and DP pulses equal bilaterally.  Resp: Normal respiratory rate and effort. CTAB, no wheezes, rhonchi, crackles. Intermittent wet cough. Abd: Soft, non-tender, non-distended. No rebound tenderness or guarding.  GU: Deferred. MSK:  No peripheral edema or signs of trauma. Extremities without deformity or TTP. No cyanosis or clubbing. Skin: warm, dry. No rashes or lesions. Back: No CVA tenderness Neuro: A&Ox4, CNs II-XII grossly intact. MAEs. Sensation grossly intact.  Psych: Normal mood and affect.   ED Results / Procedures / Treatments   Labs (all labs ordered are listed, but only abnormal results are displayed) Labs Reviewed  RESP PANEL BY RT-PCR (RSV, FLU A&B, COVID)  RVPGX2 - Abnormal; Notable for the  following components:      Result Value   SARS Coronavirus 2 by RT PCR POSITIVE (*)    All other components within normal limits  CBC WITH DIFFERENTIAL/PLATELET - Abnormal; Notable for the following components:   Hemoglobin 15.4 (*)    HCT 47.5 (*)    All other components within normal limits  BASIC METABOLIC PANEL - Abnormal; Notable for the following components:   Glucose, Bld 100 (*)    Calcium 8.8 (*)    All other components within normal limits  BLOOD GAS, VENOUS - Abnormal; Notable for the following components:   pH, Ven 7.44 (*)    pO2, Ven <31 (*)    Bicarbonate 38.0 (*)    Acid-Base Excess 11.2 (*)    All other components within normal limits  D-DIMER, QUANTITATIVE    EKG EKG Interpretation  Date/Time:  Monday June 21 2022 17:25:53 EST Ventricular Rate:  86 PR Interval:  124 QRS Duration: 79 QT Interval:  358 QTC Calculation: 429 R Axis:   64 Text Interpretation: Sinus rhythm RSR' in V1 or V2, probably normal variant Confirmed by Cindee Lame [phone]) on 06/21/2022 6:07:38 PM  Radiology DG Chest Portable 1 View  Result Date: 06/21/2022 CLINICAL DATA:  Provided history: Shortness of breath. EXAM: PORTABLE CHEST 1 VIEW COMPARISON:  Chest CT 02/01/2020. FINDINGS: Heart size within normal limits. Aortic atherosclerosis. Emphysema, as was demonstrated on the prior chest CT of 02/01/2020. Biapical pleuroparenchymal scarring. No appreciable airspace consolidation. No evidence of pleural effusion or pneumothorax. No acute bony abnormality identified. IMPRESSION: No evidence of acute cardiopulmonary abnormality. Aortic Atherosclerosis (ICD10-I70.0) and Emphysema (ICD10-J43.9). Electronically Signed   By: Kellie Simmering D.O.   On: 06/21/2022 17:01    Procedures Procedures    Medications Ordered in ED Medications  lactated ringers bolus 1,000 mL (1,000 mLs Intravenous New Bag/Given 06/21/22 1735)  ipratropium-albuterol (DUONEB) 0.5-2.5 (3) MG/3ML nebulizer solution 3 mL (3  mLs Nebulization Given 06/21/22 1734)  acetaminophen (TYLENOL) tablet 1,000 mg (1,000 mg Oral Given 06/21/22 1733)  ketorolac (TORADOL) 15 MG/ML injection 15 mg (15 mg Intravenous Given 06/21/22 1734)  prochlorperazine (COMPAZINE) injection 10 mg (10 mg Intravenous Given 06/21/22 1733)  diphenhydrAMINE (BENADRYL) capsule 25 mg (25 mg Oral Given 06/21/22 1733)    ED Course/ Medical Decision Making/ A&P                          Medical Decision Making Amount and/or Complexity of Data Reviewed Labs: ordered. Decision-making details documented in ED Course. Radiology: ordered. Decision-making details documented in ED Course.  Risk OTC drugs. Prescription drug management. Decision regarding hospitalization.    This patient presents to the ED for concern of headache and hypoxia, this involves an extensive number of treatment options, and is a complaint that carries with it a high risk of complications and morbidity.  I considered the following differential and admission for this acute, potentially life threatening condition.   MDM:    For patient's  headache, consider migraine headache given unilateral +photophobia/nausea/vomiting.  She states she has never had a headache like this before and so we will obtain a CT head to evaluate for ICH or hydrocephalus the patient has no focal neurodeficits at this time.  She has no visual changes or temple tenderness to indicate temporal arteritis.  No focal neurodeficits to indicate a brain tumor. Will treat with a headache cocktail and reassess. Also could be sinusitis as patient stated though afebrile.  Patient is noted to have a wet cough as well as hypoxia to 80% on room air in triage.  She does not note any increase shortness of breath but has chronic bronchiectasis and so always is coughing.  Consider pneumonia, URI such as COVID flu or RSV, pulmonary edema, pleural effusion, COPD exacerbation.  Patient does not wheezing on exam will give a DuoNeb to  assess response.  Also consider PE though she has no signs or symptoms of DVT, cannot PERC out due to age but will obtain a dimer and reassess.   Clinical Course as of 06/21/22 1834  Mon Jun 21, 2022  1659 O2 Flow Rate (L/min): 3 L/min [HN]  1659 O2 Device(S): Nasal Cannula [HN]  1659 SpO2: 92 % [HN]  1706 DG Chest Portable 1 View FINDINGS: Heart size within normal limits. Aortic atherosclerosis. Emphysema, as was demonstrated on the prior chest CT of 02/01/2020. Biapical pleuroparenchymal scarring. No appreciable airspace consolidation. No evidence of pleural effusion or pneumothorax. No acute bony abnormality identified.  IMPRESSION: No evidence of acute cardiopulmonary abnormality.   [HN]  1807 SARS Coronavirus 2 by RT PCR(!): POSITIVE [HN]  1813 D-Dimer, Quant: 0.37 Neg [HN]  [id number] Pt w/ covid-19 positive and hypoxia. Neg dimer, no c/f PE. Headache cocktail in progress, CTH pending. Low c/f acute intracranial abnormality. Will consult to hospitalist for admission. [HN]  1830 Patient admitted to hospitalist [HN]    Clinical Course User Index [HN] Audley Hose, MD    Labs: I Ordered, and personally interpreted labs.  The pertinent results include:  those listed above  Imaging Studies ordered: I ordered imaging studies including CXR, CTH I independently visualized and interpreted imaging. I agree with the radiologist interpretation  Additional history obtained from son at bedside, chart review.   Cardiac Monitoring: The patient was maintained on a cardiac monitor.  I personally viewed and interpreted the cardiac monitored which showed an underlying rhythm of: NSR  Reevaluation: After the interventions noted above, I reevaluated the patient and found that they have :improved  Social Determinants of Health: Patient lives independently   Disposition:  admitted to hospitalist  Co morbidities that complicate the patient evaluation  Past Medical History:  Diagnosis  Date   Allergic rhinitis    Anxiety    Chronic pain    COPD (chronic obstructive pulmonary disease) ([place])    Lumbar spondylosis    Respiratory bronchiolitis interstitial lung disease      Medicines Meds ordered this encounter  Medications   lactated ringers bolus 1,000 mL   ipratropium-albuterol (DUONEB) 0.5-2.5 (3) MG/3ML nebulizer solution 3 mL   acetaminophen (TYLENOL) tablet 1,000 mg   ketorolac (TORADOL) 15 MG/ML injection 15 mg   prochlorperazine (COMPAZINE) injection 10 mg   diphenhydrAMINE (BENADRYL) capsule 25 mg    I have reviewed the patients home medicines and have made adjustments as needed  Problem List / ED Course: Problem List Items Addressed This Visit   None Visit Diagnoses     COVID-19    -  Primary   Migraine without status migrainosus, not intractable, unspecified migraine type       Relevant Medications   acetaminophen (TYLENOL) tablet 1,000 mg (Completed)   ketorolac (TORADOL) 15 MG/ML injection 15 mg (Completed)   Hypoxia                       This note was created using dictation software, which may contain spelling or grammatical errors.    Audley Hose, MD 06/21/22 [phone]

## 2022-06-21 NOTE — H&P (Addendum)
History and Physical    Doris Obrien [id number] DOB: [birth year] DOA: 06/21/2022  PCP: Christain [place], MD Patient coming from: Home  Chief Complaint: headache  HPI: Doris Obrien is a 62 y.o. female with medical history significant of bronchiectasis/COPD, anxiety, chronic back pain who presents for a headache.  She notes that the pain has been occurring off and on, on the right side of her head for a bout a week.  She thought it was related to her sinuses.  She was having photophobia and some nausea.  When she presented to the ED, she was noted to be hypoxic to the 80s, which rapidly improved to the low 90s on 3LNC.  In the course of work up, she was noted to be COVID +.    She specifically denies fever, chills, vomiting, chest pain, SOB, worsening cough (has cough at baseline), abdominal pain, diarrhea, constipation, rash, wounds, dysuria.  She does not know of any sick contacts.   ED Course: In the ED, she had labs showing a mildly elevated H/H, VBG showing a very low oxygenation, D dimer < 0.5.  CXR showed no acute abnormality. She was started on oxygen, given fluids and a breathing treatment and TR[facility] was called for admission.   Review of Systems: As per HPI otherwise all other systems reviewed and are negative.   Past Medical History:  Diagnosis Date   Allergic rhinitis    Anxiety    Chronic pain    COPD (chronic obstructive pulmonary disease) ([place])    Lumbar spondylosis    Respiratory bronchiolitis interstitial lung disease     Past Surgical History:  Procedure Laterality Date   COLONOSCOPY  07/12/2012   SEPTOPLASTY  03/29/1993   TUBAL LIGATION  1980    Social History  reports that she has been smoking cigarettes. She has a 10.00 pack-year smoking history. She has never used smokeless tobacco. She reports that she does not drink alcohol and does not use drugs.  No Known Allergies  Family History  Problem Relation Age of Onset   Depression Other    Diabetes Other     Breast cancer Mother    Breast cancer Maternal Grandfather    Colon cancer Sister 72   Colon polyps Sister    Colon polyps Father    Colon polyps Brother    Esophageal cancer Neg Hx    Rectal cancer Neg Hx    Stomach cancer Neg Hx     Prior to Admission medications   Medication Sig Start Date End Date Taking? Authorizing Provider  albuterol (PROVENTIL HFA;VENTOLIN HFA) 108 (90 Base) MCG/ACT inhaler TAKE 2 PUFFS BY MOUTH EVERY 4 HOURS AS NEEDED FOR WHEEZE 10/21/17   [provider]  alprazolam Duanne Moron) 2 MG tablet Take 2 mg by mouth 3 (three) times daily as needed for anxiety.    [provider]  Cholecalciferol (VITAMIN D3) 2000 units capsule Take by mouth.    [provider]  HYDROcodone-acetaminophen (NORCO) 10-325 MG per tablet Take 1 tablet by mouth every 6 (six) hours as needed for pain.    [provider]  ipratropium-albuterol (DUONEB) 0.5-2.5 (3) MG/3ML SOLN Use with nebulizer up to 4 times a day as needed. 03/24/16   [provider]  mometasone-formoterol (DULERA) 200-5 MCG/ACT AERO Inhale 2 puffs into the lungs 2 (two) times daily. 01/08/13   Viyuoh, Alison Stalling, MD         Multiple Vitamin (MULTIVITAMIN) tablet Take 1 tablet by mouth daily.  [provider]  sertraline (ZOLOFT) 100 MG tablet Take 150 mg by mouth daily.    [provider]  vitamin C (ASCORBIC ACID) 500 MG tablet Take 500 mg by mouth daily.    [provider]    Physical Exam: Vitals:   06/21/22 1612 06/21/22 1730 06/21/22 1800 06/21/22 1830  BP:  (!) 134/92 130/80 126/75  Pulse: 100 89 88 93  Resp:  16 (!) 21 19  Temp:      TempSrc:      SpO2: 92% 96% 97% 96%  Weight:      Height:        Constitutional: NAD, calm, comfortable, wearing a mask Eyes: injected conjunctivae bilaterally ENMT: Mucous membranes are mildly dry.  Neck: normal, supple, no masses, no thyromegaly Respiratory: Decreased breath sounds throughout, no wheezing,  no rales Cardiovascular: RR, NR, no murmur noted, no pedal edema Abdomen: +BS, NT, ND Musculoskeletal: Decreased bulk, however, she has normal tone, easily moving in bed.  Skin: no rashes, lesions, ulcers on exposed skin Neurologic: non focal, speech is normal  Psychiatric: Normal judgment and insight.   Labs on Admission: I have personally reviewed following labs and imaging studies  CBC: Recent Labs  Lab 06/21/22 1714  WBC 8.8  NEUTROABS 6.9  HGB 15.4*  HCT 47.5*  MCV 98.5  PLT [id number]    Basic Metabolic Panel: Recent Labs  Lab 06/21/22 1714  NA 138  K 4.0  CL 98  CO2 31  GLUCOSE 100*  BUN 10  CREATININE 0.60  CALCIUM 8.8*    GFR: Estimated Creatinine Clearance: 66.5 mL/min (by C-G formula based on SCr of 0.6 mg/dL).  Liver Function Tests: No results for input(s): "AST", "ALT", "ALKPHOS", "BILITOT", "PROT", "ALBUMIN" in the last 168 hours.  Urine analysis: No results found for: "COLORURINE", "APPEARANCEUR", "LABSPEC", "PHURINE", "GLUCOSEU", "HGBUR", "BILIRUBINUR", "KETONESUR", "PROTEINUR", "UROBILINOGEN", "NITRITE", "LEUKOCYTESUR"  Radiological Exams on Admission: CT Head Wo Contrast  Result Date: 06/21/2022 CLINICAL DATA:  Headaches EXAM: CT HEAD WITHOUT CONTRAST TECHNIQUE: Contiguous axial images were obtained from the base of the skull through the vertex without intravenous contrast. RADIATION DOSE REDUCTION: This exam was performed according to the departmental dose-optimization program which includes automated exposure control, adjustment of the mA and/or kV according to patient size and/or use of iterative reconstruction technique. COMPARISON:  None Available. FINDINGS: Brain: No evidence of acute infarction, hemorrhage, hydrocephalus, extra-axial collection or mass lesion/mass effect. Vascular: No hyperdense vessel or unexpected calcification. Skull: Normal. Negative for fracture or focal lesion. Sinuses/Orbits: No acute finding. Other: None. IMPRESSION: No acute  intracranial abnormality noted. Electronically Signed   By: Inez Catalina M.D.   On: 06/21/2022 18:59   DG Chest Portable 1 View  Result Date: 06/21/2022 CLINICAL DATA:  Provided history: Shortness of breath. EXAM: PORTABLE CHEST 1 VIEW COMPARISON:  Chest CT 02/01/2020. FINDINGS: Heart size within normal limits. Aortic atherosclerosis. Emphysema, as was demonstrated on the prior chest CT of 02/01/2020. Biapical pleuroparenchymal scarring. No appreciable airspace consolidation. No evidence of pleural effusion or pneumothorax. No acute bony abnormality identified. IMPRESSION: No evidence of acute cardiopulmonary abnormality. Aortic Atherosclerosis (ICD10-I70.0) and Emphysema (ICD10-J43.9). Electronically Signed   By: Kellie Simmering D.O.   On: 06/21/2022 17:01    EKG: Independently reviewed. NSR, normal rate  Assessment/Plan  Hypoxemia COVID 19+ Headache - Ddimer < 0.5, will check other inflammatory markers and trend as well - Oxygen to keep saturations > 90% given h/o lung disease - Continuous pulse ox - Duonebs PRN -  Remdesivir ordered - Dexamethsone 77m daily - Airborne and contact precautions - Repeat CXR for any worsening of symptoms - Headache likely related to hypoxemia - Tylenol and norco for headache  Bronchiectasis COPD - Continue dulera - Continue inhalers and duonebs (if approved for COVID + by RT) - Dexamethasone as noted above  Tobacco use disorder - Cessation education ordered - She feels she will not need a patch at this time.     Chronic low back pain - PRN norco at home dose    Generalized anxiety disorder - Continue PRN Xanax - Continue sertraline  Elevated H/H - Trend, appears to be normal at baseline - Repeat in the AM - Received fluids in the ED - Encourage normal PO intake.   DVT prophylaxis: Lovenox  Code Status:   Full, confirmed with patient  Family Communication:   Disposition Plan:   Patient is from:  Home  Anticipated DC  to:  Home  Anticipated DC date:  06/23/22  Anticipated DC barriers: Improvement in respiratory status  Consults called:  None Admission status:  IP, Telemetry   Severity of Illness: The appropriate patient status for this patient is INPATIENT. Inpatient status is judged to be reasonable and necessary in order to provide the required intensity of service to ensure the patient's safety. The patient's presenting symptoms, physical exam findings, and initial radiographic and laboratory data in the context of their chronic comorbidities is felt to place them at high risk for further clinical deterioration. Furthermore, it is not anticipated that the patient will be medically stable for discharge from the hospital within 2 midnights of admission.   * I certify that at the point of admission it is my clinical judgment that the patient will require inpatient hospital care spanning beyond 2 midnights from the point of admission due to high intensity of service, high risk for further deterioration and high frequency of surveillance required.*Gilles ChiquitoMD [facility]  How to contact the TD[facility] LLCAttending or Consulting provider [place] covering provider during after hours [place] for this patient?   Check the care team in [facility]and look for a) attending/consulting [facility] provider listed and b) the TCommunity [facility] Hospitalteam listed Log into [email or web address] and use [place]'s universal password to access. If you do not have the password, please contact the hospital operator. Locate the TEl[facility] Surgery LLCprovider you are looking for under [facility] and page to a number that you can be directly reached. If you still have difficulty reaching the provider, please page the D[facility](Director on Call) for the Hospitalists listed on amion for assistance.  06/21/2022, 7:43 PM

## 2022-06-21 NOTE — ED Triage Notes (Addendum)
Patient said the right side of her head has been hurting for a week on and off. Said she has been taking mucinex, she thought it was a sinus headache. Sensitive to light.   Patient 80% room air oxygen saturation in triage. Patient placed on 4L [place] and improved to 90%.

## 2022-06-22 DIAGNOSIS — U071 COVID-19: Secondary | ICD-10-CM

## 2022-06-22 DIAGNOSIS — F411 Generalized anxiety disorder: Secondary | ICD-10-CM

## 2022-06-22 DIAGNOSIS — J471 Bronchiectasis with (acute) exacerbation: Secondary | ICD-10-CM

## 2022-06-22 DIAGNOSIS — F17200 Nicotine dependence, unspecified, uncomplicated: Secondary | ICD-10-CM

## 2022-06-22 LAB — CBC WITH DIFFERENTIAL/PLATELET
Abs Immature Granulocytes: 0.04 10*3/uL (ref 0.00–0.07)
Basophils Absolute: 0 10*3/uL (ref 0.0–0.1)
Basophils Relative: 0 %
Eosinophils Absolute: 0 10*3/uL (ref 0.0–0.5)
Eosinophils Relative: 0 %
HCT: 43.4 % (ref 36.0–46.0)
Hemoglobin: 14.2 g/dL (ref 12.0–15.0)
Immature Granulocytes: 0 %
Lymphocytes Relative: 7 %
Lymphs Abs: 0.7 10*3/uL (ref 0.7–4.0)
MCH: 32.3 pg (ref 26.0–34.0)
MCHC: 32.7 g/dL (ref 30.0–36.0)
MCV: 98.6 fL (ref 80.0–100.0)
Monocytes Absolute: 0.5 10*3/uL (ref 0.1–1.0)
Monocytes Relative: 5 %
Neutro Abs: 8.6 10*3/uL — ABNORMAL HIGH (ref 1.7–7.7)
Neutrophils Relative %: 88 %
Platelets: 188 10*3/uL (ref 150–400)
RBC: 4.4 MIL/uL (ref 3.87–5.11)
RDW: 11.8 % (ref 11.5–15.5)
WBC: 9.8 10*3/uL (ref 4.0–10.5)
nRBC: 0 % (ref 0.0–0.2)

## 2022-06-22 LAB — D-DIMER, QUANTITATIVE: D-Dimer, Quant: 0.38 ug/mL-FEU (ref 0.00–0.50)

## 2022-06-22 LAB — COMPREHENSIVE METABOLIC PANEL
ALT: 14 U/L (ref 0–44)
AST: 20 U/L (ref 15–41)
Albumin: 3.1 g/dL — ABNORMAL LOW (ref 3.5–5.0)
Alkaline Phosphatase: 72 U/L (ref 38–126)
Anion gap: 12 (ref 5–15)
BUN: 11 mg/dL (ref 8–23)
CO2: 29 mmol/L (ref 22–32)
Calcium: 8.6 mg/dL — ABNORMAL LOW (ref 8.9–10.3)
Chloride: 97 mmol/L — ABNORMAL LOW (ref 98–111)
Creatinine, Ser: 0.67 mg/dL (ref 0.44–1.00)
GFR, Estimated: >60 mL/min (ref >60)
Glucose, Bld: 118 mg/dL — ABNORMAL HIGH (ref 70–99)
Potassium: 4.3 mmol/L (ref 3.5–5.1)
Sodium: 138 mmol/L (ref 135–145)
Total Bilirubin: 0.6 mg/dL (ref 0.3–1.2)
Total Protein: 6 g/dL — ABNORMAL LOW (ref 6.5–8.1)

## 2022-06-22 LAB — C-REACTIVE PROTEIN
CRP: 4.3 mg/dL — ABNORMAL HIGH (ref <1.0)
CRP: 8 mg/dL — ABNORMAL HIGH (ref <1.0)

## 2022-06-22 LAB — FERRITIN: Ferritin: 201 ng/mL (ref 11–307)

## 2022-06-22 LAB — PHOSPHORUS: Phosphorus: 4.9 mg/dL — ABNORMAL HIGH (ref 2.5–4.6)

## 2022-06-22 LAB — PROCALCITONIN: Procalcitonin: <0.1 ng/mL

## 2022-06-22 LAB — HIV ANTIBODY (ROUTINE TESTING W REFLEX): HIV Screen 4th Generation wRfx: NONREACTIVE

## 2022-06-22 LAB — MAGNESIUM: Magnesium: 2.1 mg/dL (ref 1.7–2.4)

## 2022-06-22 MED ORDER — GUAIFENESIN-DM 100-10 MG/5ML PO SYRP
5.0000 mL | ORAL_SOLUTION | ORAL | Status: DC | PRN
  Administered 2022-06-22: 5 mL via ORAL
  Filled 2022-06-22: qty 5

## 2022-06-22 NOTE — Progress Notes (Signed)
PROGRESS NOTE    KORYN FRETZ  [id number] DOB: 22-Jul-1960 DOA: 06/21/2022 PCP: Christain Sacramento, MD    Brief Narrative:  Doris Obrien is a 62 y.o. female with medical history significant for bronchiectasis/COPD, anxiety, chronic back pain who presented to the hospital with headache for a week thought to be secondary to sinusitis associated with some photophobia and nausea.  When she came to the ED she was noted to be hypoxic in the 80s and was given 3 L of oxygen with improvement of pulse ox at 90%.  Patient also complained of some cough but without any fever or chills.  D-dimer was less than 0.5.  Chest x-ray did not show any acute infiltrate.  Patient was then admitted hospital for further evaluation and treatment.   Assessment and plan.    Hypoxemia secondary to COVID-19 disease.  History of COPD/bronchiectasis  Will continue with bronchodilators, supplemental oxygen.  Currently on 3 L of oxygen.  Patient has been started on dexamethasone and remdesivir.  Continue Dulera.  Continue airborne and contact precautions.    Headache likely secondary to hypoxemia.  Continue symptomatic care with Tylenol.  CRP elevated at 8.0 from 4.3.  Ferritin 201.  WBC at 9.8.  HIV nonreactive.  Continue to wean oxygen as able.   Tobacco use disorder Has been smoking half a pack a day.  Counseled against it.  She does not want a nicotine patch.    Chronic low back pain - PRN norco at home dose    Generalized anxiety disorder Continue as needed Xanax and sertraline.   Elevated H/H Will trend.  Repeat CBC in AM.     DVT prophylaxis: enoxaparin (LOVENOX) injection 40 mg Start: 06/21/22 2200   Code Status:     Code Status: Full Code  Disposition: Likely home in 1 to 2 days  Status is: Inpatient Remains inpatient appropriate because: Hypoxia,   Family Communication: None  Consultants:  None  Procedures:  None  Antimicrobials:  Remdesivir  Anti-infectives (From admission, onward)     Start     Dose/Rate Route Frequency Ordered Stop   06/22/22 1000  remdesivir 100 mg in sodium chloride 0.9 % 100 mL IVPB  Status:  Discontinued       See Hyperspace for full Linked Orders Report.   100 mg 200 mL/hr over 30 Minutes Intravenous Daily 06/21/22 2017 06/21/22 2033   06/21/22 2017  remdesivir 200 mg in sodium chloride 0.9% 250 mL IVPB  Status:  Discontinued       See Hyperspace for full Linked Orders Report.   200 mg 580 mL/hr over 30 Minutes Intravenous Once 06/21/22 2017 06/21/22 2033       Subjective: Today, patient was seen and examined at bedside.  Patient states that she feels a little better with breathing no chest pain but has some cough.  Has mild headache.  Not on supplemental oxygen  Objective: Vitals:   06/21/22 2101 06/22/22 0115 06/22/22 0504 06/22/22 0925  BP: 133/87 (!) 141/86 (!) 129/90 (!) 132/99  Pulse: 80 81 78 83  Resp: 17 19 18 20  $ Temp: 97.7 F (36.5 C) 97.9 F (36.6 C) 97.6 F (36.4 C) 97.8 F (36.6 C)  TempSrc:    Oral  SpO2: 92% 91% 96% 91%  Weight:      Height:       No intake or output data in the 24 hours ending 06/22/22 1140 Filed Weights   06/21/22 1602  Weight: 66 kg  Physical Examination: Body mass index is 25.77 kg/m.  General:  Average built, not in obvious distress, on nasal cannula oxygen HENT:   No scleral pallor or icterus noted. Oral mucosa is moist.  Chest:    Diminished breath sounds bilaterally. No crackles or wheezes.  CVS: S1 &S2 heard. No murmur.  Regular rate and rhythm. Abdomen: Soft, nontender, nondistended.  Bowel sounds are heard.   Extremities: No cyanosis, clubbing or edema.  Peripheral pulses are palpable. Psych: Alert, awake and oriented, normal mood CNS:  No cranial nerve deficits.  Power equal in all extremities.   Skin: Warm and dry.  No rashes noted.  Data Reviewed:   CBC: Recent Labs  Lab 06/21/22 1714 06/22/22 0608  WBC 8.8 9.8  NEUTROABS 6.9 8.6*  HGB 15.4* 14.2  HCT 47.5* 43.4   MCV 98.5 98.6  PLT 205 0000000    Basic Metabolic Panel: Recent Labs  Lab 06/21/22 1714 06/21/22 2042 06/22/22 0608  NA 138  --  138  K 4.0  --  4.3  CL 98  --  97*  CO2 31  --  29  GLUCOSE 100*  --  118*  BUN 10  --  11  CREATININE 0.60 0.69 0.67  CALCIUM 8.8*  --  8.6*  MG  --   --  2.1  PHOS  --   --  4.9*    Liver Function Tests: Recent Labs  Lab 06/22/22 0608  AST 20  ALT 14  ALKPHOS 72  BILITOT 0.6  PROT 6.0*  ALBUMIN 3.1*     Radiology Studies: CT Head Wo Contrast  Result Date: 06/21/2022 CLINICAL DATA:  Headaches EXAM: CT HEAD WITHOUT CONTRAST TECHNIQUE: Contiguous axial images were obtained from the base of the skull through the vertex without intravenous contrast. RADIATION DOSE REDUCTION: This exam was performed according to the departmental dose-optimization program which includes automated exposure control, adjustment of the mA and/or kV according to patient size and/or use of iterative reconstruction technique. COMPARISON:  None Available. FINDINGS: Brain: No evidence of acute infarction, hemorrhage, hydrocephalus, extra-axial collection or mass lesion/mass effect. Vascular: No hyperdense vessel or unexpected calcification. Skull: Normal. Negative for fracture or focal lesion. Sinuses/Orbits: No acute finding. Other: None. IMPRESSION: No acute intracranial abnormality noted. Electronically Signed   By: Inez Catalina M.D.   On: 06/21/2022 18:59   DG Chest Portable 1 View  Result Date: 06/21/2022 CLINICAL DATA:  Provided history: Shortness of breath. EXAM: PORTABLE CHEST 1 VIEW COMPARISON:  Chest CT 02/01/2020. FINDINGS: Heart size within normal limits. Aortic atherosclerosis. Emphysema, as was demonstrated on the prior chest CT of 02/01/2020. Biapical pleuroparenchymal scarring. No appreciable airspace consolidation. No evidence of pleural effusion or pneumothorax. No acute bony abnormality identified. IMPRESSION: No evidence of acute cardiopulmonary abnormality.  Aortic Atherosclerosis (ICD10-I70.0) and Emphysema (ICD10-J43.9). Electronically Signed   By: Kellie Simmering D.O.   On: 06/21/2022 17:01      LOS: 1 day    Flora Lipps, MD [facility] Available via Epic secure chat 7am-7pm After these hours, please refer to coverage provider listed on [email or web address] 06/22/2022, 11:40 AM

## 2022-06-22 NOTE — Plan of Care (Signed)
  Problem: Education: Goal: Knowledge of disease or condition will improve Outcome: Progressing Goal: Knowledge of the prescribed therapeutic regimen will improve Outcome: Progressing Goal: Individualized Educational Video(s) Outcome: Progressing   Problem: Activity: Goal: Ability to tolerate increased activity will improve Outcome: Progressing Goal: Will verbalize the importance of balancing activity with adequate rest periods Outcome: Progressing   Problem: Respiratory: Goal: Ability to maintain a clear airway will improve Outcome: Progressing Goal: Levels of oxygenation will improve Outcome: Progressing Goal: Ability to maintain adequate ventilation will improve Outcome: Progressing   Problem: Education: Goal: Knowledge of risk factors and measures for prevention of condition will improve Outcome: Progressing   Problem: Coping: Goal: Psychosocial and spiritual needs will be supported Outcome: Progressing   Problem: Respiratory: Goal: Will maintain a patent airway Outcome: Progressing Goal: Complications related to the disease process, condition or treatment will be avoided or minimized Outcome: Progressing   Problem: Education: Goal: Knowledge of General Education information will improve Description: Including pain rating scale, medication(s)/side effects and non-pharmacologic comfort measures Outcome: Progressing   Problem: Health Behavior/Discharge Planning: Goal: Ability to manage health-related needs will improve Outcome: Progressing   Problem: Clinical Measurements: Goal: Ability to maintain clinical measurements within normal limits will improve Outcome: Progressing Goal: Will remain free from infection Outcome: Progressing Goal: Diagnostic test results will improve Outcome: Progressing Goal: Respiratory complications will improve Outcome: Progressing Goal: Cardiovascular complication will be avoided Outcome: Progressing   Problem: Activity: Goal:  Risk for activity intolerance will decrease Outcome: Progressing   Problem: Nutrition: Goal: Adequate nutrition will be maintained Outcome: Progressing   Problem: Coping: Goal: Level of anxiety will decrease Outcome: Progressing   Problem: Elimination: Goal: Will not experience complications related to bowel motility Outcome: Progressing Goal: Will not experience complications related to urinary retention Outcome: Progressing   Problem: Pain Managment: Goal: General experience of comfort will improve Outcome: Progressing   Problem: Safety: Goal: Ability to remain free from injury will improve Outcome: Progressing   Problem: Skin Integrity: Goal: Risk for impaired skin integrity will decrease Outcome: Progressing

## 2022-06-22 NOTE — Progress Notes (Signed)
Connected patient to continuous bedside pulse ox monitor.

## 2022-06-22 NOTE — Hospital Course (Signed)
Doris Obrien is a 62 y.o. female with medical history significant for bronchiectasis/COPD, anxiety, chronic back pain who presented to the hospital with headache for a week thought to be secondary to sinusitis associated with some photophobia and nausea.  When she came to the ED she was noted to be hypoxic in the 80s and was given 3 L of oxygen with improvement of pulse ox at 90%.  Patient also complained of some cough but without any fever or chills.  D-dimer was less than 0.5.  Chest x-ray did not show any acute infiltrate.  Patient was then admitted hospital for further evaluation and treatment.   Assessment and plan.    Hypoxemia secondary to COVID-19 disease.  History of COPD/bronchiectasis  Will continue with bronchodilators, supplemental oxygen.  Currently on 3 L of oxygen.  Patient has been started on dexamethasone and remdesivir.  Continue Dulera.  Continue airborne and contact precautions.    Headache likely secondary to hypoxemia.  Continue symptomatic care with Tylenol.  CRP elevated at 8.0 from 4.3.  Ferritin 201.  WBC at 9.8.  HIV nonreactive.  Continue to wean oxygen as able.   Tobacco use disorder Has been smoking half a pack a day.  Counseled against it.  She does not want a nicotine patch.    Chronic low back pain - PRN norco at home dose    Generalized anxiety disorder Continue as needed Xanax and sertraline.   Elevated H/H Will trend.  Repeat CBC in AM.

## 2022-06-23 DIAGNOSIS — M5440 Lumbago with sciatica, unspecified side: Secondary | ICD-10-CM

## 2022-06-23 DIAGNOSIS — G8929 Other chronic pain: Secondary | ICD-10-CM

## 2022-06-23 LAB — CBC WITH DIFFERENTIAL/PLATELET
Abs Immature Granulocytes: 0.07 10*3/uL (ref 0.00–0.07)
Basophils Absolute: 0 10*3/uL (ref 0.0–0.1)
Basophils Relative: 0 %
Eosinophils Absolute: 0 10*3/uL (ref 0.0–0.5)
Eosinophils Relative: 0 %
HCT: 43.5 % (ref 36.0–46.0)
Hemoglobin: 14.3 g/dL (ref 12.0–15.0)
Immature Granulocytes: 1 %
Lymphocytes Relative: 6 %
Lymphs Abs: 0.9 10*3/uL (ref 0.7–4.0)
MCH: 32.4 pg (ref 26.0–34.0)
MCHC: 32.9 g/dL (ref 30.0–36.0)
MCV: 98.4 fL (ref 80.0–100.0)
Monocytes Absolute: 0.9 10*3/uL (ref 0.1–1.0)
Monocytes Relative: 6 %
Neutro Abs: 12.7 10*3/uL — ABNORMAL HIGH (ref 1.7–7.7)
Neutrophils Relative %: 87 %
Platelets: 278 10*3/uL (ref 150–400)
RBC: 4.42 MIL/uL (ref 3.87–5.11)
RDW: 11.9 % (ref 11.5–15.5)
WBC: 14.7 10*3/uL — ABNORMAL HIGH (ref 4.0–10.5)
nRBC: 0 % (ref 0.0–0.2)

## 2022-06-23 LAB — COMPREHENSIVE METABOLIC PANEL
ALT: 15 U/L (ref 0–44)
AST: 20 U/L (ref 15–41)
Albumin: 3.4 g/dL — ABNORMAL LOW (ref 3.5–5.0)
Alkaline Phosphatase: 72 U/L (ref 38–126)
Anion gap: 9 (ref 5–15)
BUN: 9 mg/dL (ref 8–23)
CO2: 29 mmol/L (ref 22–32)
Calcium: 8.5 mg/dL — ABNORMAL LOW (ref 8.9–10.3)
Chloride: 97 mmol/L — ABNORMAL LOW (ref 98–111)
Creatinine, Ser: 0.58 mg/dL (ref 0.44–1.00)
GFR, Estimated: >60 mL/min (ref >60)
Glucose, Bld: 122 mg/dL — ABNORMAL HIGH (ref 70–99)
Potassium: 3.9 mmol/L (ref 3.5–5.1)
Sodium: 135 mmol/L (ref 135–145)
Total Bilirubin: 0.5 mg/dL (ref 0.3–1.2)
Total Protein: 6.5 g/dL (ref 6.5–8.1)

## 2022-06-23 LAB — C-REACTIVE PROTEIN: CRP: 16.8 mg/dL — ABNORMAL HIGH (ref <1.0)

## 2022-06-23 LAB — D-DIMER, QUANTITATIVE: D-Dimer, Quant: 0.34 ug/mL-FEU (ref 0.00–0.50)

## 2022-06-23 LAB — GLUCOSE, CAPILLARY: Glucose-Capillary: 111 mg/dL — ABNORMAL HIGH (ref 70–99)

## 2022-06-23 LAB — MAGNESIUM: Magnesium: 1.9 mg/dL (ref 1.7–2.4)

## 2022-06-23 LAB — FERRITIN: Ferritin: 282 ng/mL (ref 11–307)

## 2022-06-23 LAB — PHOSPHORUS: Phosphorus: 4.7 mg/dL — ABNORMAL HIGH (ref 2.5–4.6)

## 2022-06-23 MED ORDER — GUAIFENESIN-DM 100-10 MG/5ML PO SYRP: 5.0000 mL | ORAL_SOLUTION | ORAL | 0 refills | Status: AC | PRN

## 2022-06-23 MED ORDER — DEXAMETHASONE 6 MG PO TABS: 6.0000 mg | ORAL_TABLET | ORAL | 0 refills | Status: AC

## 2022-06-23 NOTE — Progress Notes (Signed)
SATURATION QUALIFICATIONS: (This note is used to comply with regulatory documentation for home oxygen)  Patient Saturations on Room Air at Rest = 90%  Patient Saturations on Room Air while Ambulating = 83%  Patient Saturations on 3 Liters of oxygen while Ambulating = 92%  Please briefly explain why patient needs home oxygen:

## 2022-06-23 NOTE — TOC Transition Note (Signed)
Transition of Care [facility]) - CM/SW Discharge Note   Patient Details  Name: Doris Obrien MRN: [id number] Date of Birth: [birth year]  [facility] CM/SW Contact:  Ninfa Meeker, RN Phone Number: 06/23/2022, 11:39 AM   Clinical Narrative:    Patient needs oxygen for home. Saturations 83% without. Oxygen tank  for transport home.   Final next level of care: Home/Self Care     Patient Goals and CMS Choice      Discharge Placement                         Discharge Plan and Services Additional resources added to the After Visit Summary for     Discharge Planning Services: CM Consult Post Acute Care Choice: Durable Medical Equipment          DME Arranged: Oxygen DME Agency: [employer] Date DME Agency Contacted: 06/23/22 Time DME Agency Contacted: [phone] Representative spoke with at DME Agency: Brenton Grills HH Arranged: NA [place] Agency: NA        Social Determinants of Health ([employer]) Interventions SDOH Screenings   Food Insecurity: No Food Insecurity (06/21/2022)  Housing: Low Risk  (06/21/2022)  Transportation Needs: No Transportation Needs (06/21/2022)  Utilities: Not At Risk (06/21/2022)  Tobacco Use: High Risk (06/21/2022)     Readmission Risk Interventions     No data to display

## 2022-06-23 NOTE — Discharge Summary (Signed)
Physician Discharge Summary  Doris Obrien [id number] DOB: [birth year] DOA: 06/21/2022  PCP: Doris Sacramento, MD  Admit date: 06/21/2022 Discharge date: 06/23/2022  Admitted From: Home  Discharge disposition: home    Recommendations for Outpatient Follow-Up:   Follow up with your primary care provider in one week.  Check CBC, BMP, magnesium in the next visit Continue to use oxygen until you see your primary care provider to decide whether you will need to continue or not.  Discharge Diagnosis:   Principal Problem:   Hypoxemia Active Problems:   Bronchiectasis (HCC)   Tobacco use disorder   Chronic low back pain   Generalized anxiety disorder   COVID-19 virus infection   Discharge Condition: Improved.  Diet recommendation: Regular  Wound care: None.  Code status: Full.   History of Present Illness:   Doris Obrien is a 62 y.o. female with medical history significant for bronchiectasis/COPD, anxiety, chronic back pain who presented to the hospital with headache for a week thought to be secondary to sinusitis associated with some photophobia and nausea.  When she came to the ED, she was noted to be hypoxic in the 80s and was given 3 L of oxygen with improvement of pulse ox at 90%.  Patient also complained of some cough but without any fever or chills.  D-dimer was less than 0.5.  Chest x-ray did not show any acute infiltrate.  Patient was then admitted to the hospital for further evaluation and treatment.   Hospital Course:   Following conditions were addressed during hospitalization as listed below,  Hypoxemia secondary to COVID-19 disease.  History of COPD/bronchiectasis  Patient received bronchodilators, supplemental oxygen.  Currently on 3 L of oxygen.  At this time patient has qualified for home oxygen on discharge.  Patient received IV dexamethasone and remdesivir while in the hospital for COVID infection.   CRP elevated  Ferritin 201.  WBC at 9.8.  HIV  nonreactive.  At this time patient has clinically felt much better and wishes to go home.  She was advised against smoking and states that she is motivated to quit by herself.  Patient will continue oxygen on discharge.   Tobacco use disorder Has been smoking half a pack a day.  Counseled against it.  She does not want a nicotine patch.    Chronic low back pain - PRN norco at home     Generalized anxiety disorder Continue as needed Xanax and resumed sertraline.   Elevated H/H Hemoglobin at 14.3.  Discouraged  Disposition.  At this time, patient is stable for disposition home with outpatient PCP follow-up.  Medical Consultants:   None.  Procedures:    None Subjective:   Today, patient states that she feels much better today with less cough or shortness of breath.  Has ambulated in the hallway.  Wishes to go home with oxygen if needed.  Discharge Exam:   Vitals:   06/23/22 0846 06/23/22 0849  BP: 112/74   Pulse: 82   Resp: 16   Temp: 97.7 F (36.5 C)   SpO2: 90% 92%   Vitals:   06/23/22 0453 06/23/22 0807 06/23/22 0846 06/23/22 0849  BP: 109/80  112/74   Pulse: 78 81 82   Resp: 16 14 16   $ Temp: 97.9 F (36.6 C)  97.7 F (36.5 C)   TempSrc: Oral  Oral   SpO2: 91% 95% 90% 92%  Weight:      Height:       Body  mass index is 25.77 kg/m.  General: Alert awake, not in obvious distress on nasal cannula oxygen HENT: pupils equally reacting to light,  No scleral pallor or icterus noted. Oral mucosa is moist.  Chest:    Diminished breath sounds bilaterally.  CVS: S1 &S2 heard. No murmur.  Regular rate and rhythm. Abdomen: Soft, nontender, nondistended.  Bowel sounds are heard.   Extremities: No cyanosis, clubbing or edema.  Peripheral pulses are palpable. Psych: Alert, awake and oriented, normal mood CNS:  No cranial nerve deficits.  Power equal in all extremities.   Skin: Warm and dry.  No rashes noted.  The results of significant diagnostics from this  hospitalization (including imaging, microbiology, ancillary and laboratory) are listed below for reference.     Diagnostic Studies:   CT Head Wo Contrast  Result Date: 06/21/2022 CLINICAL DATA:  Headaches EXAM: CT HEAD WITHOUT CONTRAST TECHNIQUE: Contiguous axial images were obtained from the base of the skull through the vertex without intravenous contrast. RADIATION DOSE REDUCTION: This exam was performed according to the departmental dose-optimization program which includes automated exposure control, adjustment of the mA and/or kV according to patient size and/or use of iterative reconstruction technique. COMPARISON:  None Available. FINDINGS: Brain: No evidence of acute infarction, hemorrhage, hydrocephalus, extra-axial collection or mass lesion/mass effect. Vascular: No hyperdense vessel or unexpected calcification. Skull: Normal. Negative for fracture or focal lesion. Sinuses/Orbits: No acute finding. Other: None. IMPRESSION: No acute intracranial abnormality noted. Electronically Signed   By: Doris Obrien M.D.   On: 06/21/2022 18:59   DG Chest Portable 1 View  Result Date: 06/21/2022 CLINICAL DATA:  Provided history: Shortness of breath. EXAM: PORTABLE CHEST 1 VIEW COMPARISON:  Chest CT 02/01/2020. FINDINGS: Heart size within normal limits. Aortic atherosclerosis. Emphysema, as was demonstrated on the prior chest CT of 02/01/2020. Biapical pleuroparenchymal scarring. No appreciable airspace consolidation. No evidence of pleural effusion or pneumothorax. No acute bony abnormality identified. IMPRESSION: No evidence of acute cardiopulmonary abnormality. Aortic Atherosclerosis (ICD10-I70.0) and Emphysema (ICD10-J43.9). Electronically Signed   By: Doris Obrien D.O.   On: 06/21/2022 17:01     Labs:   Basic Metabolic Panel: Recent Labs  Lab 06/21/22 1714 06/21/22 2042 06/22/22 0608 06/23/22 0550  NA 138  --  138 135  K 4.0  --  4.3 3.9  CL 98  --  97* 97*  CO2 31  --  29 29  GLUCOSE  100*  --  118* 122*  BUN 10  --  11 9  CREATININE 0.60 0.69 0.67 0.58  CALCIUM 8.8*  --  8.6* 8.5*  MG  --   --  2.1 1.9  PHOS  --   --  4.9* 4.7*   GFR Estimated Creatinine Clearance: 66.5 mL/min (by C-G formula based on SCr of 0.58 mg/dL). Liver Function Tests: Recent Labs  Lab 06/22/22 0608 06/23/22 0550  AST 20 20  ALT 14 15  ALKPHOS 72 72  BILITOT 0.6 0.5  PROT 6.0* 6.5  ALBUMIN 3.1* 3.4*   No results for input(s): "LIPASE", "AMYLASE" in the last 168 hours. No results for input(s): "AMMONIA" in the last 168 hours. Coagulation profile No results for input(s): "INR", "PROTIME" in the last 168 hours.  CBC: Recent Labs  Lab 06/21/22 1714 06/22/22 0608 06/23/22 0550  WBC 8.8 9.8 14.7*  NEUTROABS 6.9 8.6* 12.7*  HGB 15.4* 14.2 14.3  HCT 47.5* 43.4 43.5  MCV 98.5 98.6 98.4  PLT 205 188 278   Cardiac Enzymes: No results for  input(s): "CKTOTAL", "CKMB", "CKMBINDEX", "TROPONINI" in the last 168 hours. BNP: Invalid input(s): "POCBNP" CBG: Recent Labs  Lab 06/23/22 0758  GLUCAP 111*   D-Dimer Recent Labs    06/22/22 0608 06/23/22 0550  DDIMER 0.38 0.34   Hgb A1c No results for input(s): "HGBA1C" in the last 72 hours. Lipid Profile No results for input(s): "CHOL", "HDL", "LDLCALC", "TRIG", "CHOLHDL", "LDLDIRECT" in the last 72 hours. Thyroid function studies No results for input(s): "TSH", "T4TOTAL", "T3FREE", "THYROIDAB" in the last 72 hours.  Invalid input(s): "FREET3" Anemia work up Recent Labs    06/22/22 0608 06/23/22 0550  FERRITIN 201 87   Microbiology Recent Results (from the past 240 hour(s))  Resp panel by RT-PCR (RSV, Flu A&B, Covid) Anterior Nasal Swab     Status: Abnormal   Collection Time: 06/21/22  5:15 PM   Specimen: Anterior Nasal Swab  Result Value Ref Range Status   SARS Coronavirus 2 by RT PCR POSITIVE (A) NEGATIVE Final    Comment: (NOTE) SARS-CoV-2 target nucleic acids are DETECTED.  The SARS-CoV-2 RNA is generally  detectable in upper respiratory specimens during the acute phase of infection. Positive results are indicative of the presence of the identified virus, but do not rule out bacterial infection or co-infection with other pathogens not detected by the test. Clinical correlation with patient history and other diagnostic information is necessary to determine patient infection status. The expected result is Negative.  Fact Sheet for Patients: EntrepreneurPulse.com.au  Fact Sheet for Healthcare Providers: IncredibleEmployment.be  This test is not yet approved or cleared by the [place] FDA and  has been authorized for detection and/or diagnosis of SARS-CoV-2 by FDA under an Emergency Use Authorization (EUA).  This EUA will remain in effect (meaning this test can be used) for the duration of  the COVID-19 declaration under Section 564(b)(1) of the A ct, 21 U.S.C. section 360bbb-3(b)(1), unless the authorization is terminated or revoked sooner.     Influenza A by PCR NEGATIVE NEGATIVE Final   Influenza B by PCR NEGATIVE NEGATIVE Final    Comment: (NOTE) The Xpert Xpress SARS-CoV-2/FLU/RSV plus assay is intended as an aid in the diagnosis of influenza from Nasopharyngeal swab specimens and should not be used as a sole basis for treatment. Nasal washings and aspirates are unacceptable for Xpert Xpress SARS-CoV-2/FLU/RSV testing.  Fact Sheet for Patients: EntrepreneurPulse.com.au  Fact Sheet for Healthcare Providers: IncredibleEmployment.be  This test is not yet approved or cleared by the [place] FDA and has been authorized for detection and/or diagnosis of SARS-CoV-2 by FDA under an Emergency Use Authorization (EUA). This EUA will remain in effect (meaning this test can be used) for the duration of the COVID-19 declaration under Section 564(b)(1) of the Act, 21 U.S.C. section 360bbb-3(b)(1), unless the  authorization is terminated or revoked.     Resp Syncytial Virus by PCR NEGATIVE NEGATIVE Final    Comment: (NOTE) Fact Sheet for Patients: EntrepreneurPulse.com.au  Fact Sheet for Healthcare Providers: IncredibleEmployment.be  This test is not yet approved or cleared by the [place] FDA and has been authorized for detection and/or diagnosis of SARS-CoV-2 by FDA under an Emergency Use Authorization (EUA). This EUA will remain in effect (meaning this test can be used) for the duration of the COVID-19 declaration under Section 564(b)(1) of the Act, 21 U.S.C. section 360bbb-3(b)(1), unless the authorization is terminated or revoked.  Performed at [facility], [place] [address], [place], [place] [address]      Discharge Instructions:  Discharge Instructions     Call MD for:  temperature >100.4   Complete by: As directed    Diet general   Complete by: As directed    Discharge instructions   Complete by: As directed    Follow up with your primary care provider in one week.  No smoking seek medical or worsening symptoms.  Complete the course of steroids.  Continue inhalers and nebulizers at home.  No overexertion.   Increase activity slowly   Complete by: As directed       Allergies as of 06/23/2022   No Known Allergies      Medication List     TAKE these medications    Advil 200 MG Caps Generic drug: Ibuprofen Take 400 mg by mouth every 6 (six) hours as needed (for pain or headaches).   albuterol 108 (90 Base) MCG/ACT inhaler Commonly known as: VENTOLIN HFA Inhale 2 puffs into the lungs every 4 (four) hours as needed for wheezing or shortness of breath.   alprazolam 2 MG tablet Commonly known as: XANAX Take 2 mg by mouth See admin instructions. Take 2 mg by mouth four times a day and an additional 2 mg once a day as needed for unresolved anxiety   ascorbic acid 500 MG tablet Commonly known as: VITAMIN  C Take 500 mg by mouth daily.   dexamethasone 6 MG tablet Commonly known as: DECADRON Take 1 tablet (6 mg total) by mouth daily for 5 days.   guaiFENesin-dextromethorphan 100-10 MG/5ML syrup Commonly known as: ROBITUSSIN DM Take 5 mLs by mouth every 4 (four) hours as needed for cough (chest congestion).   HYDROcodone-acetaminophen 10-325 MG tablet Commonly known as: NORCO Take 1 tablet by mouth every 6 (six) hours as needed for pain.   ipratropium-albuterol 0.5-2.5 (3) MG/3ML Soln Commonly known as: DUONEB Take 3 mLs by nebulization every 6 (six) hours as needed (for wheezing or shortness of breath).   mometasone-formoterol 200-5 MCG/ACT Aero Commonly known as: DULERA Inhale 2 puffs into the lungs 2 (two) times daily.   multivitamin tablet Take 1 tablet by mouth daily with breakfast.   sertraline 100 MG tablet Commonly known as: ZOLOFT Take 150 mg by mouth in the morning.               Durable Medical Equipment  (From admission, onward)           Start     Ordered   06/23/22 1147  For home use only DME oxygen  Once       Question Answer Comment  Length of Need 6 Months   Mode or (Route) Nasal cannula   Liters per Minute 3   Frequency Continuous (stationary and portable oxygen unit needed)   Oxygen conserving device Yes   Oxygen delivery system Gas      06/23/22 1146              Time coordinating discharge: 39 minutes  Signed:  Temeka Pore  [facility] 06/23/2022, 4:33 PM

## 2023-08-02 ENCOUNTER — Other Ambulatory Visit (HOSPITAL_COMMUNITY): Payer: Self-pay | Admitting: Family Medicine

## 2023-08-02 DIAGNOSIS — Z1231 Encounter for screening mammogram for malignant neoplasm of breast: Secondary | ICD-10-CM

## 2023-08-03 ENCOUNTER — Other Ambulatory Visit (HOSPITAL_COMMUNITY): Payer: Self-pay | Admitting: Family Medicine

## 2023-08-03 DIAGNOSIS — F17200 Nicotine dependence, unspecified, uncomplicated: Secondary | ICD-10-CM

## 2023-08-03 DIAGNOSIS — J431 Panlobular emphysema: Secondary | ICD-10-CM

## 2023-08-15 ENCOUNTER — Encounter (HOSPITAL_COMMUNITY): Payer: Self-pay

## 2023-08-15 ENCOUNTER — Ambulatory Visit (HOSPITAL_COMMUNITY)
Admission: RE | Admit: 2023-08-15 | Discharge: 2023-08-15 | Disposition: A | Discharge status: Home / Self Care | Source: Ambulatory Visit | Attending: Family Medicine | Admitting: Family Medicine

## 2023-08-15 DIAGNOSIS — F17200 Nicotine dependence, unspecified, uncomplicated: Secondary | ICD-10-CM | POA: Insufficient documentation

## 2023-08-15 DIAGNOSIS — J431 Panlobular emphysema: Secondary | ICD-10-CM

## 2023-08-15 DIAGNOSIS — Z1231 Encounter for screening mammogram for malignant neoplasm of breast: Secondary | ICD-10-CM | POA: Insufficient documentation

## 2023-08-21 ENCOUNTER — Ambulatory Visit (HOSPITAL_COMMUNITY): Admission: RE | Admit: 2023-08-21 | Source: Ambulatory Visit

## 2023-08-23 ENCOUNTER — Ambulatory Visit (HOSPITAL_COMMUNITY)
Admission: RE | Admit: 2023-08-23 | Discharge: 2023-08-23 | Disposition: A | Discharge status: Home / Self Care | Source: Ambulatory Visit | Attending: Family Medicine | Admitting: Family Medicine

## 2023-08-23 DIAGNOSIS — Z122 Encounter for screening for malignant neoplasm of respiratory organs: Secondary | ICD-10-CM | POA: Diagnosis not present

## 2023-08-23 DIAGNOSIS — I700 Atherosclerosis of aorta: Secondary | ICD-10-CM | POA: Diagnosis not present

## 2023-08-23 DIAGNOSIS — F17200 Nicotine dependence, unspecified, uncomplicated: Secondary | ICD-10-CM | POA: Diagnosis present

## 2023-08-23 DIAGNOSIS — J431 Panlobular emphysema: Secondary | ICD-10-CM | POA: Diagnosis present

## 2023-08-23 DIAGNOSIS — F17210 Nicotine dependence, cigarettes, uncomplicated: Secondary | ICD-10-CM | POA: Diagnosis not present

## 2023-11-28 ENCOUNTER — Other Ambulatory Visit (HOSPITAL_COMMUNITY): Payer: Self-pay | Admitting: Family Medicine

## 2023-11-28 DIAGNOSIS — R911 Solitary pulmonary nodule: Secondary | ICD-10-CM

## 2024-01-09 ENCOUNTER — Encounter (HOSPITAL_COMMUNITY): Payer: Self-pay | Admitting: Registered Nurse

## 2024-01-09 ENCOUNTER — Ambulatory Visit (INDEPENDENT_AMBULATORY_CARE_PROVIDER_SITE_OTHER): Admitting: Registered Nurse

## 2024-01-09 VITALS — BP 155/90 | HR 69 | Ht 63.0 in | Wt 133.4 lb

## 2024-01-09 DIAGNOSIS — F1010 Alcohol abuse, uncomplicated: Secondary | ICD-10-CM

## 2024-01-09 DIAGNOSIS — F4310 Post-traumatic stress disorder, unspecified: Secondary | ICD-10-CM | POA: Diagnosis not present

## 2024-01-09 DIAGNOSIS — F411 Generalized anxiety disorder: Secondary | ICD-10-CM | POA: Diagnosis not present

## 2024-01-09 DIAGNOSIS — Z79899 Other long term (current) drug therapy: Secondary | ICD-10-CM

## 2024-01-09 DIAGNOSIS — F330 Major depressive disorder, recurrent, mild: Secondary | ICD-10-CM

## 2024-01-09 NOTE — Patient Instructions (Addendum)
 You have decided to continue with your primary care provider for psychotropic medication management.   You can talk to your primary provider about the following medication recommendations:   Medication recommendation:  Xanax  taper:  Week 1:  1 mg three times daily for 1 week, Week 2:  0.5 mg Three times daily for 1 week, Week 3:  0.25 mg three times daily for one week, Week 4:  0.25 mg Twice daily for one week, Week 5:  0.25 mg daily for one week, Week 6:  0.25 mg  every other day for one week then stop/discontinue  Start Buspar 5 mg three times daily and increase for anxiety.  Increase dosage as needed. Start Abilify 2 mg daily as an adjunct to Zoloft    Long-term use of benzodiazepines can lead to several adverse effects, including: Cognitive Impairment: Issues such as memory problems, decreased attention, and impaired cognitive abilities. Physical Health Risks: Increased risk of falls and fractures, especially in older adults.  Mental Health Issues: Mood swings, depression, and increased anxiety.  Dependence and Withdrawal: Physical dependence can develop, leading to withdrawal symptoms like irritability, sleep disturbances, and flu-like symptoms.  Social and Occupational Impact: Problems with employment and social interactions.  Gradual reduction under medical supervision is recommended to mitigate withdrawal symptoms.  Alternatives There are several alternatives to benzodiazepines for managing anxiety and related conditions. Some of the common options include: Selective Serotonin Reuptake Inhibitors (SSRIs): These medications, such as sertraline  and fluoxetine, are often used as first-line treatments for anxiety disorders.  Serotonin-Norepinephrine Reuptake Inhibitors (SNRIs): Examples include venlafaxine and duloxetine, which are also effective for anxiety.  Buspirone: This is a non-benzodiazepine medication specifically for anxiety, with a lower risk of dependence.  Beta-Blockers:  Medications like propranolol can help manage physical symptoms of anxiety, such as rapid heartbeat.  Pregabalin and Gabapentin: These are used for anxiety and have a different mechanism of action compared to benzodiazepines.  Hydroxyzine: An antihistamine that can be used for anxiety.   Types of Psychotherapy Interpersonal Psychotherapy (IPT) Focuses on treating depression related to significant loss, life changes, or interpersonal conflict. Improves interpersonal relationships and social skills. Recommended for mood disorders, anxiety, PTSD, bipolar disorder, eating disorders, postpartum depression, and borderline personality disorder. Cognitive Behavioral Therapy (CBT) Identifies and addresses negative thought patterns and beliefs. Goal-oriented and focuses on solving current challenges. Effective for addiction, depression, OCD, anxiety, chronic pain, bipolar disorder, anger management, personality disorders, eating disorders, marital conflict, and academic performance. Dialectical Behavioral Therapy (DBT) Balances acceptance and change by integrating opposing ideas. Helps identify and modify distressing behaviors. Initially developed for borderline personality disorder, but adapted for other conditions. Psychoanalytical and Psychodynamic Therapy Uncovers unconscious thoughts linked to childhood experiences. Long-term treatment performed by specially trained professionals. Used for chronic depression, anxiety disorders, somatic disorders, borderline personality disorder, PTSD, substance use disorders, and eating disorders. Humanistic Therapy Focuses on self-awareness and acceptance to reach full potential. Person-centered approach allowing clients to guide sessions. Recommended for trauma, depression, chronic conditions, anxiety, low self-esteem, relationship conflicts, personality disorders, addictions, and existential crises. Eclectic Therapy Combines techniques from different types  of psychotherapy. Flexible and adapts to individual needs and goals. Can be short-term or long-term, often used for PTSD symptoms. Choosing the Right Therapy Consider the type of concern, duration, previous diagnosis, and whether you want to understand or change behaviors. Consultations with multiple therapists can help determine the best approach. Check therapist credentials and training. Formats of Therapy Individual Therapy: One-on-one sessions. Group Therapy: Sessions with multiple participants sharing  common goals. Couple's or Family Therapy: Sessions involving family members or partners. Effectiveness of Psychotherapy Rooted in science and evidence-based. Effective for a variety of mental health conditions and personal challenges. Requires active participation and collaboration. Online Therapy As effective as in-person therapy. Ensure privacy, verify therapist credentials, and maintain a conducive environment for sessions. This overview should give you a good understanding of the different psychotherapy options and how they can help.       Call 911, 988, mobile crisis, or present to the nearest emergency room should you experience any suicidal/homicidal ideation, auditory/visual/hallucinations, or detrimental worsening of your mental health.  Mobile Crisis Response Teams Listed by counties in vicinity of [facility] providers [facility] [phone] [facility] [phone] [facility] [phone] [facility] [phone] [place]                * [facility] [phone]                * [facility] [phone]  [facility] [phone] [facility] [employer] [employer]  [phone] * [facility] [phone]

## 2024-01-09 NOTE — Progress Notes (Signed)
 Psychiatric Initial Adult Assessment   Patient Identification: Doris Obrien MRN:  [id number] Date of Evaluation:  01/09/2024 Referral Source: Lynwood Laneta ORN, PA-C  Chief Complaint:   Chief Complaint  Patient presents with   Establish Care    Medication management   Visit Diagnosis:    ICD-10-CM   1. Mild episode of recurrent major depressive disorder (HCC)  F33.0     2. GAD (generalized anxiety disorder)  F41.1     3. PTSD (post-traumatic stress disorder)  F43.10     4. Alcohol use disorder, mild, in controlled environment  F10.10     5. Long term prescription benzodiazepine use  Z79.899       History of Present Illness:  Doris Obrien 63 y.o. female presents today to establish care for medication management.  She was seen face-to-face this provide and chart reviewed on 12/28/23.  Her psychiatric history is significant for major depression, general anxiety, PTSD, and insomnia.  Her mental health is currently managed with Xanax  2 mg four times daily and additional 2 mg once daily as needed and Zoloft  150 mg daily.  She states she was referred by her primary care provider She told me I needed to see a psychiatrist and I think it has something to do with mild Xanax .  I was seeing Dr. Tanda but he retired and she took over.  After I saw her for the first time she automatically took away my prescription and Xanax .  She reports she went through withdrawal and had to go to the emergency room.  She reports since being her PCP is prescribing her Xanax .  She gives me Xanax  2 mg that I can take 4 times a day and she gives me 5 extra tablets that I can take as needed.  She reports she feels comfortable and feels that current medications are managing her mental health effectively at the doses that she is currently on.  Reports that Zoloft  was increased to 200 mg but caused her to have the tremors and was decreased back to 150 mg daily.  She reports her current stressor and the cause of her PTSD  is the anxiety that she continues to feel related to her husband's sickness before he died.  Reports while husband was still living in with his medical health after having 3 heart attacks she got to the point that she was afraid to go to sleep because she could not tell if he was breathing so would go days without sleeping.  States she still finds herself waking and reaching over checking to see if he is breathing.  She reports her husband passed in 2016.  She states I'm just missing him.  He got to see our 2 kids grow up but I hate he missed the birth of my son's baby and when I look at him all I see is him.  I hate the pain that he went through while he was living and I blame myself but I know I was there for him.  She reports she is eating without difficulty. She denies suicidal ideation, self-harm, homicidal ideation, psychosis, paranoia, and abnormal movements. Screenings completed during today's visit PHQ-9, C-SSRS, GAD-7, AIMS, AUDIT, Nutrition, and Pain, see scores below.   Treatment options discussed: Discussed Xanax  taper related to high dose of Xanax .  Discussed starting BuSpar for anxiety and adding Abilify 2 mg as an adjunct to Zoloft .  Continue the Zoloft  at 150 mg since she is unable to tolerate higher dose;  consider changing to a different antidepressant if no improvement.  She reports that her primary care provider will continue to prescribe her psychotropic medications.  I'm comfortable with the dosages she is prescribing me now.  Then states she does not think that she was sent here for medication management but for therapy.  She was informed that a therapy appointment could be set for her today and she could discuss treatment plan further with her primary care provider at their next visit.  She later declines therapy.  Informed would add recommendations for medication changes to this note into her AVS so that she could discuss the changes with her  Recommendations: Continue Zoloft  150 mg  daily, add Abilify 2 mg daily as an adjunct to Zoloft .  Start BuSpar 5 mg 3 times daily and adjust dosage as appropriate.  Start Xanax  taper related to high dose of Xanax  and long-term use of prescription benzodiazepine.  Suggested taper: Xanax  taper:  Week 1:  1 mg three times daily for 1 week, Week 2:  0.5 mg Three times daily for 1 week, Week 3:  0.25 mg three times daily for one week, Week 4:  0.25 mg Twice daily for one week, Week 5:  0.25 mg daily for one week, Week 6:  0.25 mg  every other day for one week then stop/discontinue.  Recommendations: were discussed with the patient and she was informed that she could choose to discuss this with her PCP but if continuing to see this provider a Xanax  taper would be started.  Additional information added to AVS on long-term use of benzodiazepine.  Also gave resource information on the different types of psychotherapy.  At this time she declines medication management services.  She voices her understanding/agreement of recommendations and that she is declining medication management services today.   Associated Signs/Symptoms: Depression Symptoms:  depressed mood, anhedonia, anxiety, (Hypo) Manic Symptoms:  Denies Anxiety Symptoms:  Excessive Worry, Psychotic Symptoms:  Denies PTSD Symptoms: Had a traumatic exposure:  Reports having to take care of her husband during his illness was traumatic for her. Reports she continues to have problems with sleeping and reaching over at night to see if her husband is there and checking to see if he is breathing.  Past Psychiatric History:  Diagnosis: Depression, general anxiety, PTSD, and insomnia.  Alcohol use disorder in controlled environment. Suicide attempt: Denies Non-suicidal self-injurious behavior: Denies Psychiatric hospitalization: Denies Past trauma: Denies Substance abuse: Reports she drinks 2-3 beers or a couple shots of alcohol several times a week.  Reports she also smokes marijuana or please  the marijuana leaves in coffee pot and makes a tea that she drinks a couple times a week.  Reports last use of marijuana was few days ago.  Denies other illicit drug use.  Reports she smokes a 1 to 1.5 pack of cigarettes daily. Past psychotropic medication trials: Klonopin and her current medications Zoloft  and Xanax  denies any other psychotropic medications  Previous Psychotropic Medications: Yes , see above  Substance Abuse History in the last 12 months:  Yes.    Consequences of Substance Abuse: Denies  Past Medical History:  Past Medical History:  Diagnosis Date   Allergic rhinitis    Anxiety    Chronic pain    COPD (chronic obstructive pulmonary disease) (HCC)    Lumbar spondylosis    Respiratory bronchiolitis interstitial lung disease     Past Surgical History:  Procedure Laterality Date   COLONOSCOPY  07/12/2012   SEPTOPLASTY  03/29/1993   TUBAL LIGATION  1980    Family Psychiatric History: Unaware of family psychiatric history reports all the family have undiagnosed mental illness.  Family History:  Family History  Problem Relation Age of Onset   Depression Other    Diabetes Other    Breast cancer Mother    Breast cancer Maternal Grandfather    Colon cancer Sister 65   Colon polyps Sister    Colon polyps Father    Colon polyps Brother    Esophageal cancer Neg Hx    Rectal cancer Neg Hx    Stomach cancer Neg Hx     Social History:   Social History   Socioeconomic History   Marital status: Married    Spouse name: Not on file   Number of children: Not on file   Years of education: Not on file   Highest education level: Not on file  Occupational History   Not on file  Tobacco Use   Smoking status: Every Day    Current packs/day: 0.00    Average packs/day: 0.3 packs/day for 40.0 years (10.0 ttl pk-yrs)    Types: Cigarettes    Start date: 01/08/1973    Last attempt to quit: 01/08/2013    Years since quitting: 11.0   Smokeless tobacco: Never  Vaping Use    Vaping status: Never Used  Substance and Sexual Activity   Alcohol use: No   Drug use: No   Sexual activity: Never  Other Topics Concern   Not on file  Social History Narrative   Not on file   Social Drivers of Health   Financial Resource Strain: Not on file  Food Insecurity: Low Risk  (07/27/2023)   Received from [facility]   Hunger Vital Sign    Within the past 12 months, you worried that your food would run out before you got money to buy more: Never true    Within the past 12 months, the food you bought just didn't last and you didn't have money to get more. : Never true  Transportation Needs: No Transportation Needs (07/27/2023)   Received from [facility]    In the past 12 months, has lack of reliable transportation kept you from medical appointments, meetings, work or from getting things needed for daily living? : No  Physical Activity: Not on file  Stress: Not on file  Social Connections: Not on file    Additional Social History: Reports she lives alone, unemployed on disability, has 4 children.  Has physical education 10 th grade.  Allergies:  No Known Allergies  Metabolic Disorder Labs: No labs ordered related to not continuing services with this provider No results found for: HGBA1C, MPG No results found for: PROLACTIN No results found for: CHOL, TRIG, HDL, CHOLHDL, VLDL, LDLCALC No results found for: TSH   Current Medications: Current Outpatient Medications  Medication Sig Dispense Refill   ADVIL 200 MG CAPS Take 400 mg by mouth every 6 (six) hours as needed (for pain or headaches).     albuterol  (PROVENTIL  HFA;VENTOLIN  HFA) 108 (90 Base) MCG/ACT inhaler Inhale 2 puffs into the lungs every 4 (four) hours as needed for wheezing or shortness of breath.     alprazolam  (XANAX ) 2 MG tablet Take 2 mg by mouth See admin instructions. Take 2 mg by mouth four times a day and an additional 2 mg once a day as needed for unresolved  anxiety     guaiFENesin -dextromethorphan  (ROBITUSSIN DM) 100-10 MG/5ML  syrup Take 5 mLs by mouth every 4 (four) hours as needed for cough (chest congestion). 118 mL 0   HYDROcodone -acetaminophen  (NORCO) 10-325 MG per tablet Take 1 tablet by mouth every 6 (six) hours as needed for pain.     ipratropium-albuterol  (DUONEB) 0.5-2.5 (3) MG/3ML SOLN Take 3 mLs by nebulization every 6 (six) hours as needed (for wheezing or shortness of breath).     mometasone -formoterol  (DULERA ) 200-5 MCG/ACT AERO Inhale 2 puffs into the lungs 2 (two) times daily. 8.8 g 0   Multiple Vitamin (MULTIVITAMIN) tablet Take 1 tablet by mouth daily with breakfast.     Sertraline  HCl 150 MG CAPS Take 150 mg by mouth daily.     vitamin C (ASCORBIC ACID) 500 MG tablet Take 500 mg by mouth daily.     Current Facility-Administered Medications  Medication Dose Route Frequency Provider Last Rate Last Admin   0.9 %  sodium chloride  infusion  500 mL Intravenous Once Danis, Victory LITTIE MOULD, MD        Musculoskeletal: Strength & Muscle Tone: within normal limits Gait & Station: normal Patient leans: N/A  Psychiatric Specialty Exam: Review of Systems  Constitutional:        No other complaints voiced at this time  Psychiatric/Behavioral:  Positive for agitation (Irritability), dysphoric mood and sleep disturbance. Negative for hallucinations, self-injury and suicidal ideas. The patient is nervous/anxious.   All other systems reviewed and are negative.   Blood pressure (!) 155/90, pulse 69, height 5' 3 (1.6 m), weight 133 lb 6.4 oz (60.5 kg), SpO2 90%.Body mass index is 23.63 kg/m.  General Appearance: Casual  Eye Contact:  Good  Speech:  Clear and Coherent and Normal Rate  Volume:  Normal  Mood:  Anxious and Dysphoric  Affect:  Appropriate and Congruent  Thought Process:  Coherent, Goal Directed, and Descriptions of Associations: Intact  Orientation:  Full (Time, Place, and Person)  Thought Content:  Logical and Rumination   Suicidal Thoughts:  No  Homicidal Thoughts:  No  Memory:  Immediate;   Good Recent;   Good Remote;   Good  Judgement:  Intact  Insight:  Present  Psychomotor Activity:  Normal  Concentration:  Concentration: Good and Attention Span: Good  Recall:  Good  Fund of Knowledge:Good  Language: Good  Akathisia:  No  Handed:  Right  AIMS (if indicated):  done  Assets:  Communication Skills Desire for Improvement Financial Resources/Insurance Housing Leisure Time Physical Health Resilience Social Support Transportation  ADL's:  Intact  Cognition: WNL  Sleep:  Fair   Screenings: AIMS    Flowsheet Row Office Visit from 01/09/2024 in [facility] [place]  AIMS Total Score 0   AUDIT    Flowsheet Row Office Visit from 01/09/2024 in [facility] [place]  Alcohol Use Disorder Identification Test Final Score (AUDIT) 3   GAD-7    Flowsheet Row Office Visit from 01/09/2024 in [facility] [place]  Total GAD-7 Score 3   PHQ2-9    Flowsheet Row Office Visit from 01/09/2024 in [facility] Total Score 2  PHQ-9 Total Score 6   Flowsheet Row ED to Hosp-Admission (Discharged) from [facility]/21/2022 in [facility]  C-SSRS RISK CATEGORY No Risk    Assessment and Plan:  Assessment: Summary of today's assessment: Doris Obrien appears to be doing fairly well.  Reports she was referred  by her PCP related to her Xanax .  She reports continue symptoms of depression and PTSD related to the grief over the loss of her husband, along with anxiety.  She reports her PCP is prescribing her Xanax  2 mg 4 times daily and giving her 5 extra tabs to take as needed.  After I discussion on long-term use of benzodiazepines, she was informed that this provider would assist with a Xanax  taper and start medications that would help with  her anxiety in place of Xanax  also medications that would be beneficial towards depression, anxiety, PTSD, and sleep.  Towards the end of assessment she states that she was not referred for medication management but for therapy.  In the process of setting up for counseling/therapy appointment she declines services.  She is encouraged to speak with her PCP about medication recommendations presented to her today.  She is also informed if she decides she wants medication management and/or therapy she is welcome to return. She endorses marijuana use and alcohol use.  Discussed marijuana/alcohol use and mental health.  Educational material added to her AVS. She denies suicidal/self-harm/homicidal ideation, psychosis, paranoia, and abnormal movements. During visit she was dressed appropriate for age and weather.  She was seated comfortably in chair with no noted distress.  She was alert/oriented x 4, calm/cooperative and mood congruent with affect.  She spoke in a clear tone at moderate volume, and normal pace, with good eye contact.  Her thought process was coherent, relevant, and there was no indication that she was responding to internal/external stimuli or experiencing delusional thought content.  Mild episode of recurrent major depressive disorder (HCC) Continues to have depressive symptoms.  Denies suicidal/self-harm/homicidal ideation, psychosis, and paranoia.   Suggested to continue Zoloft  150 mg daily and add Abilify 2 mg daily as an adjunct to Zoloft  for depression or switch to a different antidepressant that is helpful with depression and anxiety symptoms since she has been taking Zoloft  for years.  Add Buspar 5 mg three times daily and adjust dose as appropriate for improvement of depression and anxiety.      GAD (generalized anxiety disorder) Reports continued and worsening anxiety symptoms Continues to have depressive symptoms.  Denies suicidal/self-harm/homicidal ideation, psychosis, and  paranoia.   Suggested to continue Zoloft  150 mg daily.  Switching to a different antidepressant that is helpful with depression and anxiety symptoms since she has been taking Zoloft  for years.  Add Buspar 5 mg three times daily and adjust dose as appropriate for improvement of depression and anxiety.      PTSD (post-traumatic stress disorder) Continued PTSD symptoms related her experience of her caring for her husband during his medical illness when he was alive and continues to reach out for him during the night to see if he is breathing. And difficulty sleeping related having to check on him often during the night.   Continue Zoloft  150 mg daily.  Switching to a different antidepressant that is helpful with depression and anxiety symptoms since she has been taking Zoloft  for years.  Add Buspar 5 mg three times daily and adjust dose as appropriate for improvement of depression and anxiety.     Alcohol use disorder, mild, in controlled environment Suggested decrease in alcohol consumption and education information given on alcohol use disorder  Long term prescription benzodiazepine use Suggested:   Xanax  taper:  Week 1:  1 mg three times daily for 1 week, Week 2:  0.5 mg Three times daily for 1 week, Week 3:  0.25 mg three times daily for one week, Week 4:  0.25 mg Twice daily for one week, Week 5:  0.25 mg daily for one week, Week 6:  0.25 mg  every other day for one week then stop/discontinue. Start Buspar 5 mg three times daily and adjust dose as appropriate for improvement of depression, anxiety, and to take the place of Xanax .  Educational information on long term use of benzodiazepines also added to AVS.         Plan: Medication management: No orders of the defined types were placed in this encounter. Suggested medication recommendations to discuss with PCP:   Continue Zoloft  150 mg daily, add Abilify 2 mg daily as an adjunct to Zoloft .  Start BuSpar 5 mg 3 times daily and adjust dosage as  appropriate.  Start Xanax  taper related to high dose of Xanax  and long-term use of prescription benzodiazepine.  Suggested taper: Xanax  taper:  Week 1:  1 mg three times daily for 1 week, Week 2:  0.5 mg Three times daily for 1 week, Week 3:  0.25 mg three times daily for one week, Week 4:  0.25 mg Twice daily for one week, Week 5:  0.25 mg daily for one week, Week 6:  0.25 mg  every other day for one week then stop/discontinue.  Labs:  Most recent labs reviewed.  No lab orders indicated at this time She chose not to continue services with this provider.    Lab Orders  No laboratory test(s) ordered today     Other:  Counseling/Therapy:  Referred to counseling/therapy but declined when setting up appointment stating the she didn't need the services and referral by PCP was by accident.      Doris Obrien was instructed to call 911, [phone], mobile crisis, or present to the nearest emergency room should she experiences any suicidal/homicidal ideation, auditory/visual/hallucinations, or detrimental worsening of her mental health condition.   Doris Obrien participated in the development of this treatment plan and verbalized her understanding/agreement with plan as listed.   Follow Up: No follow up needed.  Declined services for medication management and counsel/therapy Call in the interim for any side-effects, decompensation, questions, or problems  Collaboration of Care: Medication Management AEB medication assessment and recommendations given, Primary Care Provider AEB referral to PCP for psychotropic medication suggestions/recommendations, Referral or follow-up with counselor/therapist AEB referral to counseling/therapy, and Patient refused AEB declined services for medication management and counseling/therapy  Patient/Guardian was advised Release of Information must be obtained prior to any record release in order to collaborate their care with an outside provider. Patient/Guardian was advised if  they have not already done so to contact the registration department to sign all necessary forms in order for us  to release information regarding their care.   Consent: Patient/Guardian gives verbal consent for treatment and assignment of benefits for services provided during this visit. Patient/Guardian expressed understanding and agreed to proceed.   Donnald Tabar, NP 9/8/20257:15 PM

## 2024-02-02 ENCOUNTER — Encounter (INDEPENDENT_AMBULATORY_CARE_PROVIDER_SITE_OTHER): Payer: Self-pay | Admitting: *Deleted
